# Patient Record
Sex: Female | Born: 1960 | Race: White | Hispanic: No | Marital: Married | Smoking: Former smoker
Health system: Southern US, Community
[De-identification: ages and names within clinical notes are randomized; demographics above are authoritative.]

## PROBLEM LIST (undated history)

## (undated) DIAGNOSIS — N951 Menopausal and female climacteric states: Secondary | ICD-10-CM

## (undated) DIAGNOSIS — T7840XA Allergy, unspecified, initial encounter: Secondary | ICD-10-CM

## (undated) DIAGNOSIS — K2100 Gastro-esophageal reflux disease with esophagitis, without bleeding: Secondary | ICD-10-CM

## (undated) DIAGNOSIS — Z860100 Personal history of colon polyps, unspecified: Secondary | ICD-10-CM

## (undated) DIAGNOSIS — R1310 Dysphagia, unspecified: Secondary | ICD-10-CM

## (undated) DIAGNOSIS — F329 Major depressive disorder, single episode, unspecified: Secondary | ICD-10-CM

## (undated) DIAGNOSIS — K5790 Diverticulosis of intestine, part unspecified, without perforation or abscess without bleeding: Secondary | ICD-10-CM

## (undated) DIAGNOSIS — M1712 Unilateral primary osteoarthritis, left knee: Secondary | ICD-10-CM

## (undated) DIAGNOSIS — E785 Hyperlipidemia, unspecified: Secondary | ICD-10-CM

## (undated) DIAGNOSIS — R7301 Impaired fasting glucose: Secondary | ICD-10-CM

## (undated) DIAGNOSIS — R079 Chest pain, unspecified: Secondary | ICD-10-CM

## (undated) DIAGNOSIS — M23305 Other meniscus derangements, unspecified medial meniscus, unspecified knee: Secondary | ICD-10-CM

## (undated) DIAGNOSIS — N159 Renal tubulo-interstitial disease, unspecified: Secondary | ICD-10-CM

## (undated) DIAGNOSIS — G5600 Carpal tunnel syndrome, unspecified upper limb: Secondary | ICD-10-CM

## (undated) DIAGNOSIS — K5730 Diverticulosis of large intestine without perforation or abscess without bleeding: Secondary | ICD-10-CM

## (undated) DIAGNOSIS — M1990 Unspecified osteoarthritis, unspecified site: Secondary | ICD-10-CM

## (undated) DIAGNOSIS — M4302 Spondylolysis, cervical region: Secondary | ICD-10-CM

## (undated) DIAGNOSIS — Z8719 Personal history of other diseases of the digestive system: Secondary | ICD-10-CM

## (undated) DIAGNOSIS — M8580 Other specified disorders of bone density and structure, unspecified site: Secondary | ICD-10-CM

## (undated) DIAGNOSIS — K635 Polyp of colon: Secondary | ICD-10-CM

## (undated) DIAGNOSIS — F32A Depression, unspecified: Secondary | ICD-10-CM

## (undated) DIAGNOSIS — K219 Gastro-esophageal reflux disease without esophagitis: Secondary | ICD-10-CM

## (undated) DIAGNOSIS — R7303 Prediabetes: Secondary | ICD-10-CM

## (undated) DIAGNOSIS — J309 Allergic rhinitis, unspecified: Secondary | ICD-10-CM

## (undated) DIAGNOSIS — E559 Vitamin D deficiency, unspecified: Secondary | ICD-10-CM

## (undated) DIAGNOSIS — G2581 Restless legs syndrome: Secondary | ICD-10-CM

## (undated) DIAGNOSIS — Z6836 Body mass index (BMI) 36.0-36.9, adult: Secondary | ICD-10-CM

## (undated) DIAGNOSIS — R5383 Other fatigue: Secondary | ICD-10-CM

## (undated) HISTORY — DX: Restless legs syndrome: G25.81

## (undated) HISTORY — PX: TUBAL LIGATION: SHX77

## (undated) HISTORY — PX: WISDOM TOOTH EXTRACTION: SHX21

## (undated) HISTORY — DX: Gastro-esophageal reflux disease without esophagitis: K21.9

## (undated) HISTORY — DX: Allergic rhinitis, unspecified: J30.9

## (undated) HISTORY — DX: Personal history of colonic polyps: Z86.010

## (undated) HISTORY — DX: Diverticulosis of intestine, part unspecified, without perforation or abscess without bleeding: K57.90

## (undated) HISTORY — DX: Gastro-esophageal reflux disease with esophagitis, without bleeding: K21.00

## (undated) HISTORY — DX: Chest pain, unspecified: R07.9

## (undated) HISTORY — PX: COLONOSCOPY: SHX174

## (undated) HISTORY — DX: Polyp of colon: K63.5

## (undated) HISTORY — DX: Major depressive disorder, single episode, unspecified: F32.9

## (undated) HISTORY — PX: UPPER GASTROINTESTINAL ENDOSCOPY: SHX188

## (undated) HISTORY — DX: Allergy, unspecified, initial encounter: T78.40XA

## (undated) HISTORY — PX: FOOT SURGERY: SHX648

## (undated) HISTORY — PX: HIATAL HERNIA REPAIR: SHX195

## (undated) HISTORY — DX: Body mass index (BMI) 36.0-36.9, adult: Z68.36

## (undated) HISTORY — DX: Personal history of other diseases of the digestive system: Z87.19

## (undated) HISTORY — DX: Gastro-esophageal reflux disease with esophagitis: K21.0

## (undated) HISTORY — DX: Carpal tunnel syndrome, unspecified upper limb: G56.00

## (undated) HISTORY — DX: Hyperlipidemia, unspecified: E78.5

## (undated) HISTORY — DX: Unspecified osteoarthritis, unspecified site: M19.90

## (undated) HISTORY — DX: Personal history of colon polyps, unspecified: Z86.0100

## (undated) HISTORY — DX: Other meniscus derangements, unspecified medial meniscus, unspecified knee: M23.305

## (undated) HISTORY — DX: Other fatigue: R53.83

## (undated) HISTORY — DX: Dysphagia, unspecified: R13.10

## (undated) HISTORY — DX: Unilateral primary osteoarthritis, left knee: M17.12

## (undated) HISTORY — PX: TONSILLECTOMY: SUR1361

## (undated) HISTORY — DX: Menopausal and female climacteric states: N95.1

## (undated) HISTORY — DX: Impaired fasting glucose: R73.01

## (undated) HISTORY — DX: Spondylolysis, cervical region: M43.02

## (undated) HISTORY — DX: Vitamin D deficiency, unspecified: E55.9

## (undated) HISTORY — DX: Other specified disorders of bone density and structure, unspecified site: M85.80

## (undated) HISTORY — DX: Diverticulosis of large intestine without perforation or abscess without bleeding: K57.30

## (undated) HISTORY — DX: Depression, unspecified: F32.A

---
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
## 2004-04-01 HISTORY — PX: LEG SURGERY: SHX1003

## 2005-05-30 HISTORY — PX: NECK SURGERY: SHX720

## 2008-09-27 ENCOUNTER — Ambulatory Visit: Payer: Self-pay

## 2008-09-27 ENCOUNTER — Encounter: Payer: Self-pay | Admitting: Cardiovascular Disease

## 2008-09-28 ENCOUNTER — Encounter: Payer: Self-pay | Admitting: Cardiovascular Disease

## 2011-10-05 ENCOUNTER — Encounter (HOSPITAL_COMMUNITY): Payer: Self-pay | Admitting: *Deleted

## 2011-10-05 ENCOUNTER — Emergency Department (HOSPITAL_COMMUNITY): Payer: BC Managed Care – PPO

## 2011-10-05 ENCOUNTER — Emergency Department (HOSPITAL_COMMUNITY)
Admission: EM | Admit: 2011-10-05 | Discharge: 2011-10-05 | Disposition: A | Discharge status: Home / Self Care | Payer: BC Managed Care – PPO | Attending: Emergency Medicine | Admitting: Emergency Medicine

## 2011-10-05 DIAGNOSIS — M2390 Unspecified internal derangement of unspecified knee: Secondary | ICD-10-CM

## 2011-10-05 MED ORDER — PREDNISONE 20 MG PO TABS
60.0000 mg | ORAL_TABLET | Freq: Once | ORAL | Status: AC
  Administered 2011-10-05: 60 mg via ORAL
  Filled 2011-10-05: qty 3

## 2011-10-05 MED ORDER — PREDNISONE 20 MG PO TABS: 40.0000 mg | ORAL_TABLET | Freq: Every day | ORAL | Status: AC

## 2011-10-05 MED ORDER — OXYCODONE-ACETAMINOPHEN 5-325 MG PO TABS
2.0000 | ORAL_TABLET | Freq: Once | ORAL | Status: AC
  Administered 2011-10-05: 2 via ORAL
  Filled 2011-10-05: qty 2

## 2011-10-05 MED ORDER — OXYCODONE-ACETAMINOPHEN 5-325 MG PO TABS: 1.0000 | ORAL_TABLET | ORAL | Status: AC | PRN

## 2011-10-05 NOTE — Progress Notes (Signed)
Orthopedic Tech Progress Note Patient Details:  Brenda Allen Apr 23, 1960 [id number]  Ortho Devices Type of Ortho Device: Crutches Ortho Device/Splint Interventions: Application   [name] 10/05/2011, 9:35 PM

## 2011-10-05 NOTE — ED Notes (Signed)
Pt states that she has been having a pop with her left knee for the past couple of weeks when straighting out her leg she feels a crack/pop which normally doesn't hurt today however she straightened her leg and felt a strong pop and a painful. Pt states cannot walk. Pt put brace from previous injury on left leg for support.

## 2011-10-05 NOTE — ED Provider Notes (Signed)
History     CSN: [id number]  Arrival date & time 10/05/11  1308   None     Chief Complaint  Patient presents with  . Leg Pain    (Consider location/radiation/quality/duration/timing/severity/associated sxs/prior treatment) HPI Comments: Patient states she was getting on a riding lawnmower, when she felt a severe pop in her left knee, with inability to bear weight.  She has a previous history of a Baker's cyst, on the same leg for of which she had a previous brace, which he applied.  A taken no over-the-counter medications for pain.  She does have an orthopedist in [place] who she will be able to see on Monday  Patient is a 51 y.o. female presenting with leg pain. The history is provided by the patient.  Leg Pain  The incident occurred 3 to 5 hours ago. The incident occurred at home. The injury mechanism was torsion. The pain is present in the left knee. The quality of the pain is described as aching. The pain is at a severity of 8/10. The pain is severe. The pain has been constant since onset. Associated symptoms include inability to bear weight and muscle weakness. Pertinent negatives include no numbness, no loss of sensation and no tingling.    History reviewed. No pertinent past medical history.  History reviewed. No pertinent past surgical history.  History reviewed. No pertinent family history.  History  Substance Use Topics  . Smoking status: Never Smoker   . Smokeless tobacco: Not on file  . Alcohol Use: Yes    OB History    Grav Para Term Preterm Abortions TAB SAB Ect Mult Living                  Review of Systems  Constitutional: Negative for fever.  Respiratory: Negative for shortness of breath.   Musculoskeletal: Negative for joint swelling.  Skin: Negative for wound.  Neurological: Negative for dizziness, tingling, weakness and numbness.    Allergies  Review of patient's allergies indicates no known allergies.  Home Medications   Current Outpatient Rx    Name Route Sig Dispense Refill  . HYDROCODONE-ACETAMINOPHEN 5-500 MG PO TABS Oral Take 0.5 tablets by mouth every 6 (six) hours as needed.    Marland Kitchen OMEPRAZOLE 20 MG PO CPDR Oral Take 20 mg by mouth every other day.    Marland Kitchen PHENTERMINE HCL 37.5 MG PO CAPS Oral Take 37.5 mg by mouth every morning.    Marland Kitchen ROPINIROLE HCL 0.25 MG PO TABS Oral Take 0.25 mg by mouth at bedtime as needed. For restless leg syndrome    . TOPIRAMATE 25 MG PO TABS Oral Take 25 mg by mouth at bedtime.    . OXYCODONE-ACETAMINOPHEN 5-325 MG PO TABS Oral Take 1 tablet by mouth every 4 (four) hours as needed for pain. 30 tablet 0  . PREDNISONE 20 MG PO TABS Oral Take 2 tablets (40 mg total) by mouth daily. 20 tablet 0    BP 130/84  Pulse 100  Temp 98.3 F (36.8 C) (Oral)  Resp 18  SpO2 98%  Physical Exam  Constitutional: She appears well-developed and well-nourished.  HENT:  Head: Normocephalic.  Eyes: Pupils are equal, round, and reactive to light.  Neck: Normal range of motion.  Cardiovascular: Normal rate.   Pulmonary/Chest: Effort normal.  Musculoskeletal: She exhibits tenderness. She exhibits no edema.       Legs: Neurological: She is alert.  Skin: Skin is warm. No rash noted. No erythema.    ED  Course  Procedures (including critical care time)  Labs Reviewed - No data to display Dg Knee Complete 4 Views Left  10/05/2011  *RADIOLOGY REPORT*  Clinical Data: Popping sound.  Pain.  LEFT KNEE - COMPLETE 4+ VIEW  Comparison: None.  Findings: No visible joint effusion.  No joint space narrowing.  No focal osseous lesion.  Fabella present dorsal to the lateral femur.  IMPRESSION: No pathologic finding at radiography.  Original Report Authenticated By: Thomasenia Sales, M.D.     1. Knee derangement syndrome       MDM   X-rays are negative for fracture, subluxation, or dislocation.  We'll provide pain medication, as well as anti-inflammatory in the form of steroids and refer patient back to her orthopedist in [place]  on Monday        Arman Filter, NP 10/05/11 2100  Arman Filter, NP 10/05/11 2100

## 2011-10-05 NOTE — ED Notes (Signed)
Pt for discharge.vital signs stable and GCS15.

## 2011-10-11 NOTE — ED Provider Notes (Signed)
Medical screening examination/treatment/procedure(s) were performed by non-physician practitioner and as supervising physician I was immediately available for consultation/collaboration.  Omarie Parcell, MD 10/11/11 0001 

## 2012-11-26 DIAGNOSIS — M1611 Unilateral primary osteoarthritis, right hip: Secondary | ICD-10-CM | POA: Insufficient documentation

## 2012-11-26 HISTORY — DX: Unilateral primary osteoarthritis, right hip: M16.11

## 2013-07-29 ENCOUNTER — Ambulatory Visit (INDEPENDENT_AMBULATORY_CARE_PROVIDER_SITE_OTHER): Payer: BC Managed Care – PPO

## 2013-07-29 VITALS — BP 139/86 | HR 64 | Resp 12

## 2013-07-29 DIAGNOSIS — R52 Pain, unspecified: Secondary | ICD-10-CM

## 2013-07-29 DIAGNOSIS — B351 Tinea unguium: Secondary | ICD-10-CM

## 2013-07-29 DIAGNOSIS — S9031XA Contusion of right foot, initial encounter: Secondary | ICD-10-CM

## 2013-07-29 MED ORDER — TAVABOROLE 5 % EX SOLN
CUTANEOUS | Status: DC
Start: 2013-07-29 — End: 2017-10-17

## 2013-07-29 MED ORDER — MELOXICAM 15 MG PO TABS: 15.0000 mg | ORAL_TABLET | Freq: Every day | ORAL | Status: DC

## 2013-07-29 NOTE — Progress Notes (Signed)
   Subjective:    Patient ID: Brenda Allen, female    DOB: 23-Aug-1960, 53 y.o.   MRN: [id number]  HPI  PT STATED RT FOOT HEEL IS BEEN HURTING FOR 2 MONTHS. THE FOOT IS BEEN THE SAME NOT GETTING WORSE. THE FOOT GET AGGRAVATED BY PRESSURE STANDING OR PRESSURE. TRIED TO USED ICE BUT DOES NOT HELP.  ALSO, BOTH GREAT AND 5TH TOENAIL HAVE DISCOOLRATION FOR 5 MONTHS. THE TOES ARE GETTING WORSE AND GETTING DARKER. TRIED ANTI FUNGUS MED AND IT HELP BUT THE FUNGUS CAME BACK.   Review of Systems  Constitutional: Positive for fatigue.  Musculoskeletal:       Swelling feet and legs.       Objective:   Physical Exam Lower extremity objective findings as follows neurovascular status is intact pedal pulses palpable DP and PT posterior were for capillary refill time 3 seconds all digits epicritic and proprioceptive sensations intact and symmetric bilateral there is normal plantar response DTRs not listed neurologically skin color pigment and hair growth are normal both hallux nails show some yellowing and thickening discoloration distal one third consistent with early onychomycosis possible contusion of toes. Orthopedic biomechanical exam reveals rectus foot type x-rays reveal well-developed inferior calcaneal spur fascial thickening there is a history of contusion while riding a bike she [place] heel about 2 months ago maybe persistent calcaneal bone bruise versus a calcaneal bursitis. At this time pain on palpation central portion of the calcaneal inferiorly patient is also recently been wearing a valgus heel wedge from her orthopedic doctor to try to help trim the foot out and reviewed reviewed reduce her bowleggedness however I suggested a discontinue the valgus wedge which may be contributing to the heel pain and promontory changes of the foot patient can only get to vertical she could not evert the heel which is what your looking for. Recommend discontinue the valgus wedge temporarily to see if the heel  pain resolves or improve.       Assessment & Plan:  Assessment this time is onychomycosis nails initiate topical antifungal therapy prescription for keratin is for [facility] to [facility] patient apply once daily for 12 months duration as instructed  Assessment #2 is calcaneal bruise or contusion with possible calcaneal bursitis recommended MOBIC 15 minutes once daily patient has some at home a new prescription we also furnished also recommend warm compress ice pack alternating hot and cold therapy is discontinued the heel wedge and recheck in one month if no improvement may be a steroid injection for follow next  Harriet Masson DPM

## 2013-07-29 NOTE — Patient Instructions (Signed)
ICE INSTRUCTIONS  Apply ice or cold pack to the affected area at least 3 times a day for 10-15 minutes each time.  You should also use ice after prolonged activity or vigorous exercise.  Do not apply ice longer than 20 minutes at one time.  Always keep a cloth between your skin and the ice pack to prevent burns.  Being consistent and following these instructions will help control your symptoms.  We suggest you purchase a gel ice pack because they are reusable and do bit leak.  Some of them are designed to wrap around the area.  Use the method that works best for you.  Here are some other suggestions for icing.   Use a frozen bag of peas or corn-inexpensive and molds well to your body, usually stays frozen for 10 to 20 minutes.  Wet a towel with cold water and squeeze out the excess until it's damp.  Place in a bag in the freezer for 20 minutes. Then remove and use.  Alternate hot and cold compresses to the heel every evening next  Also suggest discontinuing the heel wedge

## 2013-11-25 ENCOUNTER — Ambulatory Visit (INDEPENDENT_AMBULATORY_CARE_PROVIDER_SITE_OTHER): Payer: BC Managed Care – PPO

## 2013-11-25 DIAGNOSIS — R52 Pain, unspecified: Secondary | ICD-10-CM

## 2013-11-25 DIAGNOSIS — M722 Plantar fascial fibromatosis: Secondary | ICD-10-CM

## 2013-11-25 MED ORDER — TRIAMCINOLONE ACETONIDE 10 MG/ML IJ SUSP: 10.0000 mg | Freq: Once | INTRAMUSCULAR | Status: DC

## 2013-11-25 NOTE — Patient Instructions (Signed)

## 2013-11-25 NOTE — Progress Notes (Signed)
   Subjective:    Patient ID: Brenda Allen, female    DOB: [birth year], 53 y.o.   MRN: [id number]  HPI patient presents at this time for followup has had a flareup of her heel pain right more so than left and bothering her for 54 years now seems to be getting worse has been wearing orthotics as instructed     Review of Systems no new findings or systemic changes noted     Objective:   Physical Exam 53 y.o. white female well-developed well-nourished oriented x3 presents at this time with vital signs stable did have an injury or contusion to the heel about 5 months ago that she improved however this is exacerbated again with activities walking pain on first up in the morning or getting up and time after a period of rest including in the evening. Right is more painful than left from mid arch to the medial calcaneal tubercle bilateral no history of fracture or cyst definite pain and tenderness on palpation of the fat plantar fascial band no pain or lateral compression of the heel or posterior heel no other wounds ulcerations no other abnormal findings skin color pigment and hair growth are normal no signs of infection       Assessment & Plan:  Assessment recalcitrant recurrent plantar fasciitis/heel spur syndrome bilateral right more so than left plan at this time per patient request my recommendation injection 10 mg Kenalog 20 mg Xylocaine plain infiltrated to the inferior plantar fascial band bilateral also fascial strapping applied to both feet recommended ice and patient already has a prescription for [facility] or meloxicam reappointed in one month if no significant improvement maintain stable shoes ice and fascial strapping be left in place for 5 day.  Harriet Masson DPM

## 2014-03-17 ENCOUNTER — Ambulatory Visit (INDEPENDENT_AMBULATORY_CARE_PROVIDER_SITE_OTHER): Payer: BC Managed Care – PPO

## 2014-03-17 VITALS — BP 105/60 | HR 68 | Resp 12

## 2014-03-17 DIAGNOSIS — R52 Pain, unspecified: Secondary | ICD-10-CM

## 2014-03-17 DIAGNOSIS — M7751 Other enthesopathy of right foot: Secondary | ICD-10-CM

## 2014-03-17 DIAGNOSIS — R609 Edema, unspecified: Secondary | ICD-10-CM

## 2014-03-17 DIAGNOSIS — S93601A Unspecified sprain of right foot, initial encounter: Secondary | ICD-10-CM

## 2014-03-17 MED ORDER — TRIAMCINOLONE ACETONIDE 10 MG/ML IJ SUSP
10.0000 mg | Freq: Once | INTRAMUSCULAR | Status: DC
Start: 2014-03-17 — End: 2020-08-09

## 2014-03-17 NOTE — Patient Instructions (Signed)

## 2014-03-17 NOTE — Progress Notes (Signed)
   Subjective:    Patient ID: Brenda Allen, female    DOB: November 25, 1960, 53 y.o.   MRN: [id number]  HPI PT STATED RT BALL OF THE FOOT HAVE WARTS AND BEEN HURTING FOR 2 MONTHS. THE FOOT IS GETTING WORSE AND IT MAKE HARD TO WALK. TRIED OTC CALLUS REMOVAL BUT NO HELP.   Review of Systems  All other systems reviewed and are negative.      Objective:   Physical Exam  53 year old white female well-developed well-nourished oriented 3 presents at this time with a complaint of pain and swelling. Sensation the ball of her foot she thinks it may be a wart although there is a small keratotic lesion it is not in the area or vicinity of pain she has pain in the MTP joint itself tenderness on dorsiflexion of the second digit with swelling and enlargement of the second MTP joint. Neurovascular status otherwise intact pedal pulses palpable epicritic and proprioceptive sensations intact and symmetric patient been having some swelling is on some new medications or fluid pill for that. Patient does report any history of injury trauma or definite swelling in the second MTP joint capsule x-rays reveal no fracture no other osseous abnormality soft tissue swelling around second MTP joint is evident some possible widening of the joint space. Possible mild effusion cannot rule out flexor plate tear or strain of the joint patient does work outside yard and garden may have hyperextended the toe which is now painful and tender cannot rule out a sprain of the joint/capsular tear or flexor plate tear second MTP joint right with residual capsulitis and mild arthropathy.      Assessment & Plan:  Assessment capsulitis possible flexor plate tear arthropathy second MTP right plan at this time recommended warm compress ice pack at this time injection tendons Kenalog 20 mg Xylocaine plain infiltrated the second MTP joint and soft first intermetatarsal space and plantar capsule second MTP. Patient tolerated injection well this time a  strapping of the toe in a plantar fascial splint is applied and a roll of tape is up dispensed the patient to maintain a plantar fascial strapping of the second digit MTP joint to mobilize the MTP joint itself and prevent dorsiflexion. Patient will maintain a good stable shoe warm compress ice pack and over-the-counter NSAID as needed or tolerated. Reappoint within 3-4 weeks for follow-up no barefoot no flimsy shoes or flip-flops if no improvement consider other noninvasive or invasive treatments as needed  Harriet Masson DPM

## 2014-11-11 ENCOUNTER — Encounter: Payer: Self-pay | Admitting: Podiatry

## 2014-11-11 ENCOUNTER — Ambulatory Visit (INDEPENDENT_AMBULATORY_CARE_PROVIDER_SITE_OTHER): Payer: BLUE CROSS/BLUE SHIELD | Admitting: Podiatry

## 2014-11-11 ENCOUNTER — Ambulatory Visit (INDEPENDENT_AMBULATORY_CARE_PROVIDER_SITE_OTHER): Payer: BLUE CROSS/BLUE SHIELD

## 2014-11-11 VITALS — BP 163/104 | HR 95 | Resp 18

## 2014-11-11 DIAGNOSIS — R52 Pain, unspecified: Secondary | ICD-10-CM | POA: Diagnosis not present

## 2014-11-11 DIAGNOSIS — M7751 Other enthesopathy of right foot: Secondary | ICD-10-CM

## 2014-11-11 DIAGNOSIS — S8991XS Unspecified injury of right lower leg, sequela: Secondary | ICD-10-CM | POA: Diagnosis not present

## 2014-11-11 DIAGNOSIS — S99921S Unspecified injury of right foot, sequela: Secondary | ICD-10-CM

## 2014-11-11 NOTE — Progress Notes (Signed)
   Subjective:    Patient ID: Brenda Allen, female    DOB: 21-Mar-1961, 54 y.o.   MRN: [id number]  HPI  54 year old renal presents the office with concerns of pain to the ball of her right foot which has been ongoing for several months. She was last seen with Dr. Blenda Mounts in December in which a steroid injection was given. She did have some relief after the injection however did not last very long for the pain surgery occur. She has pain in the ball of her foot mostly on the second toe joint but to weightbearing and pressure. She denies any history of injury or trauma. Denies any numbness or tingling. Denies any swelling or redness. He states it feels a "toothache". No other complaints this time    Review of Systems  All other systems reviewed and are negative.      Objective:   Physical Exam AAO x3, NAD DP/PT pulses palpable bilaterally, CRT less than 3 seconds Protective sensation intact with Simms Weinstein monofilament, vibratory sensation intact, Achilles tendon reflex intact There is tenderness palpation just distal to the second metatarsal head and the sulcus of the second digit plantarly. There is slight hammertoe contracture. There is no pain or crepitation with second MTPJ range of motion is no pinpoint bony tenderness or pain the vibratory sensation. There does appear he some discomfort upon dorsiflexion of second digit however there is no significant subluxation compared to contralateral extremity. Equinus present. No areas of tenderness to bilateral lower extremities. MMT 5/5, ROM WNL.  No open lesions or pre-ulcerative lesions.  No overlying edema, erythema, increase in warmth to bilateral lower extremities.  No pain with calf compression, swelling, warmth, erythema bilaterally.      Assessment & Plan:  54 year old female right second possible plantar plate injury; capsulitis -X-rays were obtained and reviewed with the patient.  -Treatment options discussed including all  alternatives, risks, and complications -Discussed steroid injection however she did not have long-term results as well as injections and therefore we will hold off on another one. -Dispensed offloading pads to help offload the second metatarsal phalangeal joint. -Toe crest to help stabilize the toe in plantar flexion. -Discussed shoe gear modifications -Follow-up 4 weeks or sooner if any problems arise. In the meantime, encouraged to call the office with any questions, concerns, change in symptoms.   Celesta Gentile, DPM

## 2014-12-09 ENCOUNTER — Ambulatory Visit (INDEPENDENT_AMBULATORY_CARE_PROVIDER_SITE_OTHER): Payer: BLUE CROSS/BLUE SHIELD | Admitting: Podiatry

## 2014-12-09 ENCOUNTER — Encounter: Payer: Self-pay | Admitting: Podiatry

## 2014-12-09 VITALS — BP 148/89 | HR 82 | Resp 18

## 2014-12-09 DIAGNOSIS — R52 Pain, unspecified: Secondary | ICD-10-CM

## 2014-12-09 DIAGNOSIS — S99921S Unspecified injury of right foot, sequela: Secondary | ICD-10-CM

## 2014-12-09 DIAGNOSIS — M7751 Other enthesopathy of right foot: Secondary | ICD-10-CM | POA: Diagnosis not present

## 2014-12-09 DIAGNOSIS — S8991XS Unspecified injury of right lower leg, sequela: Secondary | ICD-10-CM

## 2014-12-09 MED ORDER — TRIAMCINOLONE ACETONIDE 10 MG/ML IJ SUSP: 10.0000 mg | Freq: Once | INTRAMUSCULAR | Status: DC

## 2014-12-15 NOTE — Progress Notes (Signed)
Patient ID: Brenda Allen, female   DOB: 1960/04/07, 54 y.o.   MRN: [id number]  Subjective: 54 year old female presents the office they for follow-up evaluation of right second MTPJ pain and possible plantar plate injury. She states that she continues to have pain to the area is about the same as what it was previously. She feels that the area is tight overlying the joint of the second MPJ on the right foot. She gets some intermittent swelling along the area without any associated erythema or increase in warmth. The pain does not wake her up at night. No tingling or numbness. No other complaints at this time.  Objective: AAO 3, NAD Neurovascular status intact and unchanged This continuation of tenderness on the plantar aspect just distal to the second MTPJ plantarly on the course of the plantar plane. There is no gross subluxation compared to the contralateral extremity. Today more the pain seems to be localized over the MPJ as well as. There is mild discomfort with MPJ range of motion which is new compared to last appointment. There is mild edema overlying the joint without any associated erythema or increase in warmth. There is no crepitation with MTPJ range of motion. There is no open lesions or pre-ulcerative lesions. No pain with calf compression, swelling, warmth, erythema.  Assessment: 54 year old female right second MTPJ capsulitis, possible plantar plate injury  Plan: -Treatment options discussed including all alternatives, risks, and complications -I discussed with her joint aspiration and injection to the second MTPJ due to the swelling and the pain along the joint. I discussed her risks and, occasions the injection for which she understands and wishes to proceed. Under sterile conditions a total of 2 mL of a mixture of 2% lidocaine plain and 0.5% Marcaine was infiltrated in a regional block fashion around the aspiration site. With anesthetized the skin was then prepped in sterile fashion  with Betadine. Utilizing 18-gauge needle the second MTPJ was aspirated and clear fluid was aspirated. There is no pus or signs of infection. Next a 1/4 cc Kenalog 10, 1/4 cc dexamethasone phosphate, 1/4 cc 2% lidocaine plain was infiltrated into and around the second MTPJ. She tolerated injection well any complications. Post injection care was discussed. -Offloading pads were dispensed. -Discussed the symptoms continue should likely benefit from new custom orthotics. -Follow-up as directed or sooner if any problems are to arise. In the meantime call the office with any questions, concerns, change in symptoms.  Celesta Gentile, DPM

## 2014-12-31 HISTORY — PX: KNEE ARTHROPLASTY: SHX992

## 2015-03-29 HISTORY — PX: COLONOSCOPY: SHX174

## 2015-07-01 HISTORY — PX: HIP SURGERY: SHX245

## 2015-07-07 DIAGNOSIS — M1612 Unilateral primary osteoarthritis, left hip: Secondary | ICD-10-CM | POA: Diagnosis not present

## 2015-08-11 DIAGNOSIS — R635 Abnormal weight gain: Secondary | ICD-10-CM | POA: Diagnosis not present

## 2015-08-11 DIAGNOSIS — R5381 Other malaise: Secondary | ICD-10-CM | POA: Diagnosis not present

## 2015-09-08 DIAGNOSIS — R5383 Other fatigue: Secondary | ICD-10-CM | POA: Diagnosis not present

## 2015-09-18 DIAGNOSIS — Z6835 Body mass index (BMI) 35.0-35.9, adult: Secondary | ICD-10-CM | POA: Diagnosis not present

## 2015-09-18 DIAGNOSIS — L237 Allergic contact dermatitis due to plants, except food: Secondary | ICD-10-CM | POA: Diagnosis not present

## 2015-10-06 DIAGNOSIS — R5383 Other fatigue: Secondary | ICD-10-CM | POA: Diagnosis not present

## 2015-11-03 DIAGNOSIS — Z6834 Body mass index (BMI) 34.0-34.9, adult: Secondary | ICD-10-CM | POA: Diagnosis not present

## 2015-11-03 DIAGNOSIS — R5383 Other fatigue: Secondary | ICD-10-CM | POA: Diagnosis not present

## 2015-11-28 DIAGNOSIS — D225 Melanocytic nevi of trunk: Secondary | ICD-10-CM | POA: Diagnosis not present

## 2015-11-28 DIAGNOSIS — L578 Other skin changes due to chronic exposure to nonionizing radiation: Secondary | ICD-10-CM | POA: Diagnosis not present

## 2015-11-28 DIAGNOSIS — L821 Other seborrheic keratosis: Secondary | ICD-10-CM | POA: Diagnosis not present

## 2015-12-01 DIAGNOSIS — Z6833 Body mass index (BMI) 33.0-33.9, adult: Secondary | ICD-10-CM | POA: Diagnosis not present

## 2015-12-01 DIAGNOSIS — R5383 Other fatigue: Secondary | ICD-10-CM | POA: Diagnosis not present

## 2015-12-14 DIAGNOSIS — Z1231 Encounter for screening mammogram for malignant neoplasm of breast: Secondary | ICD-10-CM | POA: Diagnosis not present

## 2015-12-14 DIAGNOSIS — J320 Chronic maxillary sinusitis: Secondary | ICD-10-CM | POA: Diagnosis not present

## 2015-12-14 DIAGNOSIS — Z6834 Body mass index (BMI) 34.0-34.9, adult: Secondary | ICD-10-CM | POA: Diagnosis not present

## 2015-12-14 DIAGNOSIS — H6692 Otitis media, unspecified, left ear: Secondary | ICD-10-CM | POA: Diagnosis not present

## 2015-12-18 DIAGNOSIS — Z1389 Encounter for screening for other disorder: Secondary | ICD-10-CM | POA: Diagnosis not present

## 2015-12-18 DIAGNOSIS — R7301 Impaired fasting glucose: Secondary | ICD-10-CM | POA: Diagnosis not present

## 2015-12-18 DIAGNOSIS — E785 Hyperlipidemia, unspecified: Secondary | ICD-10-CM | POA: Diagnosis not present

## 2015-12-18 DIAGNOSIS — Z0000 Encounter for general adult medical examination without abnormal findings: Secondary | ICD-10-CM | POA: Diagnosis not present

## 2015-12-18 DIAGNOSIS — E559 Vitamin D deficiency, unspecified: Secondary | ICD-10-CM | POA: Diagnosis not present

## 2015-12-27 DIAGNOSIS — M1712 Unilateral primary osteoarthritis, left knee: Secondary | ICD-10-CM | POA: Diagnosis not present

## 2015-12-29 DIAGNOSIS — R5383 Other fatigue: Secondary | ICD-10-CM | POA: Diagnosis not present

## 2015-12-29 DIAGNOSIS — Z6832 Body mass index (BMI) 32.0-32.9, adult: Secondary | ICD-10-CM | POA: Diagnosis not present

## 2016-02-06 DIAGNOSIS — Z1231 Encounter for screening mammogram for malignant neoplasm of breast: Secondary | ICD-10-CM | POA: Diagnosis not present

## 2016-02-06 DIAGNOSIS — M8589 Other specified disorders of bone density and structure, multiple sites: Secondary | ICD-10-CM | POA: Diagnosis not present

## 2016-02-06 DIAGNOSIS — M8588 Other specified disorders of bone density and structure, other site: Secondary | ICD-10-CM | POA: Diagnosis not present

## 2016-03-04 DIAGNOSIS — K210 Gastro-esophageal reflux disease with esophagitis: Secondary | ICD-10-CM | POA: Diagnosis not present

## 2016-03-04 DIAGNOSIS — R1310 Dysphagia, unspecified: Secondary | ICD-10-CM | POA: Diagnosis not present

## 2016-03-11 DIAGNOSIS — R1310 Dysphagia, unspecified: Secondary | ICD-10-CM | POA: Diagnosis not present

## 2016-03-20 DIAGNOSIS — R1310 Dysphagia, unspecified: Secondary | ICD-10-CM | POA: Diagnosis not present

## 2016-03-20 DIAGNOSIS — Z79899 Other long term (current) drug therapy: Secondary | ICD-10-CM | POA: Diagnosis not present

## 2016-03-20 DIAGNOSIS — K210 Gastro-esophageal reflux disease with esophagitis: Secondary | ICD-10-CM | POA: Diagnosis not present

## 2016-03-20 DIAGNOSIS — K219 Gastro-esophageal reflux disease without esophagitis: Secondary | ICD-10-CM | POA: Diagnosis not present

## 2016-03-20 DIAGNOSIS — K222 Esophageal obstruction: Secondary | ICD-10-CM | POA: Diagnosis not present

## 2016-03-20 DIAGNOSIS — M1990 Unspecified osteoarthritis, unspecified site: Secondary | ICD-10-CM | POA: Diagnosis not present

## 2016-03-20 HISTORY — PX: UPPER GI ENDOSCOPY: SHX6162

## 2016-04-08 DIAGNOSIS — Z6835 Body mass index (BMI) 35.0-35.9, adult: Secondary | ICD-10-CM | POA: Diagnosis not present

## 2016-04-08 DIAGNOSIS — J019 Acute sinusitis, unspecified: Secondary | ICD-10-CM | POA: Diagnosis not present

## 2016-04-08 DIAGNOSIS — J208 Acute bronchitis due to other specified organisms: Secondary | ICD-10-CM | POA: Diagnosis not present

## 2016-06-28 ENCOUNTER — Ambulatory Visit (INDEPENDENT_AMBULATORY_CARE_PROVIDER_SITE_OTHER): Payer: BLUE CROSS/BLUE SHIELD | Admitting: Podiatry

## 2016-06-28 ENCOUNTER — Ambulatory Visit (INDEPENDENT_AMBULATORY_CARE_PROVIDER_SITE_OTHER): Payer: BLUE CROSS/BLUE SHIELD

## 2016-06-28 DIAGNOSIS — M2041 Other hammer toe(s) (acquired), right foot: Secondary | ICD-10-CM | POA: Diagnosis not present

## 2016-06-28 DIAGNOSIS — M779 Enthesopathy, unspecified: Secondary | ICD-10-CM

## 2016-06-28 DIAGNOSIS — M7751 Other enthesopathy of right foot: Secondary | ICD-10-CM

## 2016-06-28 DIAGNOSIS — M2011 Hallux valgus (acquired), right foot: Secondary | ICD-10-CM | POA: Diagnosis not present

## 2016-06-28 DIAGNOSIS — R52 Pain, unspecified: Secondary | ICD-10-CM | POA: Diagnosis not present

## 2016-06-28 NOTE — Progress Notes (Signed)
Subjective: 56 year old female presents the office today for concerns of recurrence her right foot pain. This been ongoing the last 6 months or so. She states that possibly she is doing very well however she started have pain and she points on the first interspace and the right foot which is the majority symptoms. She denies numbness and tingling. She also states that she is a hammertoe of the second toe which is causing irritation in shoes. She does have orthotics all of her shoes which she feels is somewhat more support than others. She cannot recall what aggravates her symptoms or if the certain insert helps with an others. She denies any recent injury or trauma. Denies any systemic complaints such as fevers, chills, nausea, vomiting. No acute changes since last appointment, and no other complaints at this time.   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 Semirigid hammertoe is present of the right second toe there is mild tenderness in the dorsal aspect of the PIPJ and is mild irritation or rubs the shoes. The majority tenderness today appears to be in the first interspace and the right foot. There is no specific area pinpoint bony tenderness or pain the vibratory sensation. There is no edema, erythema, increase in warmth.  No open lesions or pre-ulcerative lesions.  No pain with calf compression, swelling, warmth, erythema  Assessment: Right foot pain, tendinitis, hammertoe/HAV  Plan: -All treatment options discussed with the patient including all alternatives, risks, complications.  -Repeated she's were obtained and reviewed. There is no evidence of acute fracture or stress fracture identified today. -Discussed change in orthotics. I want her to pay attention the next couple weeks to see that a certain insert that helps more than others. I would try to wear 1 the incision has more arch support and this may do  better for her. -Discussed hammertoe and bunion surgery with the patient but she wishes to hold off now. We will continue his shoe gear modifications orthotics. -Discussed a steroid injection to the first interspace and the right foot and she wishes to proceed. Under sterile conditions a mixture of Kenalog and the consent was infiltrated without complications. Post injection care was discussed. -Return to clinic in 4 weeks or sooner if needed. -Patient encouraged to call the office with any questions, concerns, change in symptoms.   Celesta Gentile, DPM

## 2016-07-12 DIAGNOSIS — M1611 Unilateral primary osteoarthritis, right hip: Secondary | ICD-10-CM | POA: Diagnosis not present

## 2016-07-26 ENCOUNTER — Ambulatory Visit: Payer: BC Managed Care – PPO | Admitting: Podiatry

## 2016-09-10 DIAGNOSIS — Z6836 Body mass index (BMI) 36.0-36.9, adult: Secondary | ICD-10-CM | POA: Diagnosis not present

## 2016-09-10 DIAGNOSIS — R5381 Other malaise: Secondary | ICD-10-CM | POA: Diagnosis not present

## 2016-09-17 DIAGNOSIS — Z96652 Presence of left artificial knee joint: Secondary | ICD-10-CM | POA: Diagnosis not present

## 2016-09-17 DIAGNOSIS — M1711 Unilateral primary osteoarthritis, right knee: Secondary | ICD-10-CM | POA: Diagnosis not present

## 2016-09-19 DIAGNOSIS — N951 Menopausal and female climacteric states: Secondary | ICD-10-CM | POA: Diagnosis not present

## 2016-09-19 DIAGNOSIS — R635 Abnormal weight gain: Secondary | ICD-10-CM | POA: Diagnosis not present

## 2016-09-23 DIAGNOSIS — E039 Hypothyroidism, unspecified: Secondary | ICD-10-CM | POA: Diagnosis not present

## 2016-09-23 DIAGNOSIS — E7800 Pure hypercholesterolemia, unspecified: Secondary | ICD-10-CM | POA: Diagnosis not present

## 2016-09-23 DIAGNOSIS — E669 Obesity, unspecified: Secondary | ICD-10-CM | POA: Diagnosis not present

## 2016-09-23 DIAGNOSIS — R7301 Impaired fasting glucose: Secondary | ICD-10-CM | POA: Diagnosis not present

## 2016-09-26 ENCOUNTER — Ambulatory Visit (INDEPENDENT_AMBULATORY_CARE_PROVIDER_SITE_OTHER): Payer: BLUE CROSS/BLUE SHIELD

## 2016-09-26 ENCOUNTER — Ambulatory Visit (INDEPENDENT_AMBULATORY_CARE_PROVIDER_SITE_OTHER): Payer: BLUE CROSS/BLUE SHIELD | Admitting: Podiatry

## 2016-09-26 ENCOUNTER — Encounter: Payer: Self-pay | Admitting: Podiatry

## 2016-09-26 DIAGNOSIS — M7742 Metatarsalgia, left foot: Secondary | ICD-10-CM | POA: Diagnosis not present

## 2016-09-26 DIAGNOSIS — M7752 Other enthesopathy of left foot: Secondary | ICD-10-CM

## 2016-09-26 DIAGNOSIS — R52 Pain, unspecified: Secondary | ICD-10-CM

## 2016-09-26 NOTE — Progress Notes (Signed)
Subjective: 56 year old female presents the office of consent left foot pain which been ongoing for 6 weeks. She states that she's been active and she has been moving her daughter and on her feet more she gets pain in the ball of her foot. She is the right side is doing well. The left side she denies any recent injury or trauma she denies any numbness or tingling. She points between the second third toes which is the majority of symptoms. The pain is intermittent in nature area was a gradual onset. Denies any systemic complaints such as fevers, chills, nausea, vomiting. No acute changes since last appointment, and no other complaints at this time.   Objective: AAO x3, NAD DP/PT pulses palpable bilaterally, CRT less than 3 seconds On the left foot there is tenderness palpation along the second interspace. There is no probable neuroma identified. There is minimal edema to this area there is no erythema or increase in warmth. There is no area pinpoint bony tenderness there is no pain vibratory sensation. No open lesions or pre-ulcerative lesions.  No pain with calf compression, swelling, warmth, erythema  Assessment: Capsulitis/Bursitis Left Second Interspace  Plan: -All treatment options discussed with the patient including all alternatives, risks, complications.  -X-rays were obtained and reviewed. No evidence of acute fracture identified. -Steroid injection was completed today into the area of maximal tenderness to left second interspace and the complications. Mixture of Kenalog and local anesthetic was infiltrated. Postinjection care with discussed. -Metatarsal operative pads. Continue orthotics. -Ice to the area. -Follow-up 4 weeks or sooner if needed. -Patient encouraged to call the office with any questions, concerns, change in symptoms.   Celesta Gentile, DPM

## 2016-09-30 DIAGNOSIS — E669 Obesity, unspecified: Secondary | ICD-10-CM | POA: Diagnosis not present

## 2016-09-30 DIAGNOSIS — R7301 Impaired fasting glucose: Secondary | ICD-10-CM | POA: Diagnosis not present

## 2016-10-07 DIAGNOSIS — E669 Obesity, unspecified: Secondary | ICD-10-CM | POA: Diagnosis not present

## 2016-10-07 DIAGNOSIS — R7301 Impaired fasting glucose: Secondary | ICD-10-CM | POA: Diagnosis not present

## 2016-10-07 DIAGNOSIS — E7800 Pure hypercholesterolemia, unspecified: Secondary | ICD-10-CM | POA: Diagnosis not present

## 2016-10-08 DIAGNOSIS — Z6834 Body mass index (BMI) 34.0-34.9, adult: Secondary | ICD-10-CM | POA: Diagnosis not present

## 2016-10-08 DIAGNOSIS — R5383 Other fatigue: Secondary | ICD-10-CM | POA: Diagnosis not present

## 2016-10-14 DIAGNOSIS — E669 Obesity, unspecified: Secondary | ICD-10-CM | POA: Diagnosis not present

## 2016-10-14 DIAGNOSIS — E7800 Pure hypercholesterolemia, unspecified: Secondary | ICD-10-CM | POA: Diagnosis not present

## 2016-10-14 DIAGNOSIS — R7301 Impaired fasting glucose: Secondary | ICD-10-CM | POA: Diagnosis not present

## 2016-10-21 DIAGNOSIS — N951 Menopausal and female climacteric states: Secondary | ICD-10-CM | POA: Diagnosis not present

## 2016-10-21 DIAGNOSIS — R7301 Impaired fasting glucose: Secondary | ICD-10-CM | POA: Diagnosis not present

## 2016-10-21 DIAGNOSIS — E039 Hypothyroidism, unspecified: Secondary | ICD-10-CM | POA: Diagnosis not present

## 2016-10-21 DIAGNOSIS — E669 Obesity, unspecified: Secondary | ICD-10-CM | POA: Diagnosis not present

## 2016-10-24 ENCOUNTER — Encounter: Payer: Self-pay | Admitting: Podiatry

## 2016-10-24 ENCOUNTER — Ambulatory Visit (INDEPENDENT_AMBULATORY_CARE_PROVIDER_SITE_OTHER): Payer: BLUE CROSS/BLUE SHIELD | Admitting: Podiatry

## 2016-10-24 DIAGNOSIS — M779 Enthesopathy, unspecified: Secondary | ICD-10-CM | POA: Diagnosis not present

## 2016-10-24 DIAGNOSIS — M7752 Other enthesopathy of left foot: Secondary | ICD-10-CM | POA: Diagnosis not present

## 2016-10-25 NOTE — Progress Notes (Signed)
Subjective: Ms. Suman presents the office they for follow-up evaluation of left foot pain. She says the injection helped for 2 days for the pain started to come back. She states that she does continue with her orthotics and she brought in a couple pairs of inserts made to look at today. She denies any recent injury or trauma. She denies numbness and tingling at this time. No swelling or redness. No other concerns. Denies any systemic complaints such as fevers, chills, nausea, vomiting. No acute changes since last appointment, and no other complaints at this time.   Objective: AAO x3, NAD DP/PT pulses palpable bilaterally, CRT less than 3 seconds There is tenderness palpation on the second interspace left foot. No palpable neuroma identified this time. There is no area pinpoint bony tenderness or pain the vibratory sensation. There is no overlying edema, erythema, increase in warmth.  No open lesions or pre-ulcerative lesions.  No pain with calf compression, swelling, warmth, erythema  Assessment: Left foot second interspace capsulitis/bursitis  Plan: -All treatment options discussed with the patient including all alternatives, risks, complications.  -Today second steroid injection was performed in the second interspace without complications. A mixture of Kenalog local anesthetic was infiltrated without case. Postinjection care was discussed. -I did modify what her orthotics that are more comfortable for her to add a pad to help take pressure off this area. Discussed that she may need to have the inserts recovered. -Follow-up in 4 weeks or sooner if needed. Call any questions or concerns. -Patient encouraged to call the office with any questions, concerns, change in symptoms.   Celesta Gentile, DPM

## 2016-10-28 DIAGNOSIS — R6882 Decreased libido: Secondary | ICD-10-CM | POA: Diagnosis not present

## 2016-10-28 DIAGNOSIS — L709 Acne, unspecified: Secondary | ICD-10-CM | POA: Diagnosis not present

## 2016-10-28 DIAGNOSIS — M2550 Pain in unspecified joint: Secondary | ICD-10-CM | POA: Diagnosis not present

## 2016-10-28 DIAGNOSIS — E669 Obesity, unspecified: Secondary | ICD-10-CM | POA: Diagnosis not present

## 2016-11-04 DIAGNOSIS — E669 Obesity, unspecified: Secondary | ICD-10-CM | POA: Diagnosis not present

## 2016-11-04 DIAGNOSIS — N921 Excessive and frequent menstruation with irregular cycle: Secondary | ICD-10-CM | POA: Diagnosis not present

## 2016-11-05 DIAGNOSIS — D518 Other vitamin B12 deficiency anemias: Secondary | ICD-10-CM | POA: Diagnosis not present

## 2016-11-05 DIAGNOSIS — E663 Overweight: Secondary | ICD-10-CM | POA: Diagnosis not present

## 2016-11-05 DIAGNOSIS — J301 Allergic rhinitis due to pollen: Secondary | ICD-10-CM | POA: Diagnosis not present

## 2016-11-05 DIAGNOSIS — R5383 Other fatigue: Secondary | ICD-10-CM | POA: Diagnosis not present

## 2016-11-05 DIAGNOSIS — E782 Mixed hyperlipidemia: Secondary | ICD-10-CM | POA: Diagnosis not present

## 2016-11-11 DIAGNOSIS — R5383 Other fatigue: Secondary | ICD-10-CM | POA: Diagnosis not present

## 2016-11-11 DIAGNOSIS — N951 Menopausal and female climacteric states: Secondary | ICD-10-CM | POA: Diagnosis not present

## 2016-11-11 DIAGNOSIS — L709 Acne, unspecified: Secondary | ICD-10-CM | POA: Diagnosis not present

## 2016-11-11 DIAGNOSIS — E669 Obesity, unspecified: Secondary | ICD-10-CM | POA: Diagnosis not present

## 2016-11-18 DIAGNOSIS — E669 Obesity, unspecified: Secondary | ICD-10-CM | POA: Diagnosis not present

## 2016-11-18 DIAGNOSIS — L709 Acne, unspecified: Secondary | ICD-10-CM | POA: Diagnosis not present

## 2016-11-18 DIAGNOSIS — R7301 Impaired fasting glucose: Secondary | ICD-10-CM | POA: Diagnosis not present

## 2016-11-18 DIAGNOSIS — E039 Hypothyroidism, unspecified: Secondary | ICD-10-CM | POA: Diagnosis not present

## 2016-11-20 DIAGNOSIS — E669 Obesity, unspecified: Secondary | ICD-10-CM | POA: Diagnosis not present

## 2016-11-25 DIAGNOSIS — E7800 Pure hypercholesterolemia, unspecified: Secondary | ICD-10-CM | POA: Diagnosis not present

## 2016-11-25 DIAGNOSIS — E669 Obesity, unspecified: Secondary | ICD-10-CM | POA: Diagnosis not present

## 2016-11-28 DIAGNOSIS — E669 Obesity, unspecified: Secondary | ICD-10-CM | POA: Diagnosis not present

## 2016-11-29 ENCOUNTER — Telehealth: Payer: Self-pay | Admitting: Podiatry

## 2016-11-29 NOTE — Telephone Encounter (Signed)
I was calling to see if I can get an MRI on both feet. I'm in a lot of pain in both feet. Dr. Jacqualyn Posey said that would be the next step.

## 2016-12-03 DIAGNOSIS — E663 Overweight: Secondary | ICD-10-CM | POA: Diagnosis not present

## 2016-12-03 DIAGNOSIS — R5383 Other fatigue: Secondary | ICD-10-CM | POA: Diagnosis not present

## 2016-12-03 DIAGNOSIS — D518 Other vitamin B12 deficiency anemias: Secondary | ICD-10-CM | POA: Diagnosis not present

## 2016-12-03 DIAGNOSIS — Z6832 Body mass index (BMI) 32.0-32.9, adult: Secondary | ICD-10-CM | POA: Diagnosis not present

## 2016-12-03 DIAGNOSIS — E669 Obesity, unspecified: Secondary | ICD-10-CM | POA: Diagnosis not present

## 2016-12-04 ENCOUNTER — Telehealth: Payer: Self-pay | Admitting: *Deleted

## 2016-12-04 DIAGNOSIS — D3610 Benign neoplasm of peripheral nerves and autonomic nervous system, unspecified: Secondary | ICD-10-CM

## 2016-12-04 NOTE — Telephone Encounter (Signed)
Please schedule an MRI of the left foot to look for a neuroma. Also, please confirm with her it is her left foot as I have been treating her for the right previously.

## 2016-12-04 NOTE — Telephone Encounter (Addendum)
Pt called states she would like to schedule a MRI.12/04/2016-Pt states she is in agony with both feet, and would like both MRI'd. Dr. Jacqualyn Posey ordered MRI without and with contrast r/o neuroma. Gave orders to [facility] for pre-cert and faxed to [facility].12/31/2016-Pt emailed Pennsaid gel as the medications she had used previously. Dr. Amalia Hailey states order Pennsaid 2% 112g bottle apply to affected area 3-4 times daily. I emailed pt the rx had been sent to [facility].

## 2016-12-05 DIAGNOSIS — R7301 Impaired fasting glucose: Secondary | ICD-10-CM | POA: Diagnosis not present

## 2016-12-05 DIAGNOSIS — E669 Obesity, unspecified: Secondary | ICD-10-CM | POA: Diagnosis not present

## 2016-12-05 DIAGNOSIS — E7800 Pure hypercholesterolemia, unspecified: Secondary | ICD-10-CM | POA: Diagnosis not present

## 2016-12-11 ENCOUNTER — Telehealth: Payer: Self-pay | Admitting: *Deleted

## 2016-12-11 NOTE — Telephone Encounter (Signed)
"  Patient needs authorization for B/L foot MRI with and without contrast.  They need authorization from [facility].  She's scheduled for 12/21/2016.

## 2016-12-16 DIAGNOSIS — L709 Acne, unspecified: Secondary | ICD-10-CM | POA: Diagnosis not present

## 2016-12-16 DIAGNOSIS — E669 Obesity, unspecified: Secondary | ICD-10-CM | POA: Diagnosis not present

## 2016-12-16 DIAGNOSIS — R7301 Impaired fasting glucose: Secondary | ICD-10-CM | POA: Diagnosis not present

## 2016-12-16 DIAGNOSIS — E039 Hypothyroidism, unspecified: Secondary | ICD-10-CM | POA: Diagnosis not present

## 2016-12-19 NOTE — Telephone Encounter (Signed)
I called and left Noelle a message that Bilateral MRI for this patient was authorized by [employer].  The authorization number for the right foot is [id number] and the left foot is [id number].

## 2016-12-21 ENCOUNTER — Ambulatory Visit
Admission: RE | Admit: 2016-12-21 | Discharge: 2016-12-21 | Disposition: A | Discharge status: Home / Self Care | Payer: Self-pay | Source: Ambulatory Visit | Attending: Podiatry | Admitting: Podiatry

## 2016-12-21 DIAGNOSIS — M71471 Calcium deposit in bursa, right ankle and foot: Secondary | ICD-10-CM | POA: Diagnosis not present

## 2016-12-21 DIAGNOSIS — M71472 Calcium deposit in bursa, left ankle and foot: Secondary | ICD-10-CM | POA: Diagnosis not present

## 2016-12-21 MED ORDER — GADOBENATE DIMEGLUMINE 529 MG/ML IV SOLN
19.0000 mL | Freq: Once | INTRAVENOUS | Status: AC | PRN
  Administered 2016-12-21: 19 mL via INTRAVENOUS

## 2016-12-23 ENCOUNTER — Encounter: Payer: Self-pay | Admitting: Podiatry

## 2016-12-23 ENCOUNTER — Telehealth: Payer: Self-pay | Admitting: Podiatry

## 2016-12-23 ENCOUNTER — Ambulatory Visit (INDEPENDENT_AMBULATORY_CARE_PROVIDER_SITE_OTHER): Payer: BLUE CROSS/BLUE SHIELD | Admitting: Podiatry

## 2016-12-23 DIAGNOSIS — M7752 Other enthesopathy of left foot: Secondary | ICD-10-CM

## 2016-12-23 DIAGNOSIS — M2540 Effusion, unspecified joint: Secondary | ICD-10-CM | POA: Diagnosis not present

## 2016-12-23 NOTE — Telephone Encounter (Signed)
I had an MRI done on Saturday. I was wanting to know if I can get the results over the phone or do I have to come in? Please let me know. You can call me back at [phone]. Thank you.

## 2016-12-23 NOTE — Telephone Encounter (Signed)
IMPRESSION: 1. Mild forefoot and moderate midfoot degenerative changes. 2. No stress fracture or AVN. 3. Mild intermetatarsal bursitis between the second and third and third and fourth metatarsal heads. No findings suspicious for Morton's neuroma.  Would you like for me to schedule her to come see you or explain this to her over the phone?

## 2016-12-23 NOTE — Telephone Encounter (Signed)
Patient came in to be seen today.

## 2016-12-23 NOTE — Patient Instructions (Signed)

## 2016-12-24 ENCOUNTER — Encounter: Payer: Self-pay | Admitting: Podiatry

## 2016-12-26 NOTE — Progress Notes (Signed)
Subjective: Brenda Allen presents the office today with her husband discuss MRI results due to chronic bilateral foot pain. She states in length that she gets some joint pain in the second toe and some to 22nd and third toe. She also gets pain in between the toes on the right foot. This been a chronic issue this point of last several months. She denies any recent injury or trauma she denies any acute changes since last appointment. Denies any systemic complaints such as fevers, chills, nausea, vomiting. No acute changes since last appointment, and no other complaints at this time.   Objective: AAO x3, NAD DP/PT pulses palpable bilaterally, CRT less than 3 seconds On the left foot there is tenderness with long second metatarsal phalangeal joint and there is trace edema to this area there is no erythema or increase in warmth. There is minimal discomfort in the second interspace. There is no palpable neuroma identified. Is no pain vibratory sensation there is no area pinpoint bony tenderness. On the right foot there is tenderness on the interspace and the right foot there is no area pinpoint tenderness there is no pain the vibratory sensation. There is no overlying edema, erythema, increased warmth of the right foot. No discomfort along the plantar fascia or on the course today. The stepdown Achilles tendon which appears to be intact. There is no other area of tenderness identified at this time. No open lesions or pre-ulcerative lesions.  No pain with calf compression, swelling, warmth, erythema  MRI Right foot 12/21/2016: IMPRESSION: 1. Mild forefoot and moderate midfoot degenerative changes. 2. No stress fracture or AVN. 3. Mild intermetatarsal bursitis between the second and third and third and fourth metatarsal heads. No findings suspicious for Morton's neuroma.  MRI Left foot 12/21/2016: IMPRESSION: 1. Findings suggest un inflammatory arthropathy involving the second MTP joint as described above. Gout  is a possibility. 2. Intermetatarsal bursitis between the first and second metatarsal heads. 3. No MR findings to suggest a Morton's neuroma. 4. Midfoot degenerative changes but no stress fracture or AVN.  Assessment: Bursitis, synovitis  Plan: -All treatment options discussed with the patient including all alternatives, risks, complications.  -MRI results were discussed the patient. After discussion about possible gout recently MRI to the left foot she has no clinical symptoms of acute gout and this is likely to be a chronic issue. She states that she was fearful that it could be gout. She does that she drinks alcohol on a regular basis which she feels may be contributing to it. -Order blood worsen including uric acid, rheumatoid factor, ESR, CRP, ANA. She's going to her primary care physician on Saturday for physical and she is contracted the blood work done then. -Order to hold off on medications until we get the blood work results. For now continue orthotics as well as supportive shoes. Ice the area. We discussed natural anti-inflammatories.  Celesta Gentile, DPM -Patient encouraged to call the office with any questions, concerns, change in symptoms.

## 2016-12-28 DIAGNOSIS — M064 Inflammatory polyarthropathy: Secondary | ICD-10-CM | POA: Diagnosis not present

## 2016-12-28 DIAGNOSIS — E559 Vitamin D deficiency, unspecified: Secondary | ICD-10-CM | POA: Diagnosis not present

## 2016-12-28 DIAGNOSIS — Z0000 Encounter for general adult medical examination without abnormal findings: Secondary | ICD-10-CM | POA: Diagnosis not present

## 2016-12-28 DIAGNOSIS — Z23 Encounter for immunization: Secondary | ICD-10-CM | POA: Diagnosis not present

## 2016-12-28 DIAGNOSIS — Z1231 Encounter for screening mammogram for malignant neoplasm of breast: Secondary | ICD-10-CM | POA: Diagnosis not present

## 2016-12-28 DIAGNOSIS — E785 Hyperlipidemia, unspecified: Secondary | ICD-10-CM | POA: Diagnosis not present

## 2016-12-28 DIAGNOSIS — Z1389 Encounter for screening for other disorder: Secondary | ICD-10-CM | POA: Diagnosis not present

## 2016-12-30 DIAGNOSIS — E039 Hypothyroidism, unspecified: Secondary | ICD-10-CM | POA: Diagnosis not present

## 2016-12-30 DIAGNOSIS — R7301 Impaired fasting glucose: Secondary | ICD-10-CM | POA: Diagnosis not present

## 2016-12-30 DIAGNOSIS — E669 Obesity, unspecified: Secondary | ICD-10-CM | POA: Diagnosis not present

## 2017-01-01 DIAGNOSIS — Z6832 Body mass index (BMI) 32.0-32.9, adult: Secondary | ICD-10-CM | POA: Diagnosis not present

## 2017-01-01 DIAGNOSIS — D518 Other vitamin B12 deficiency anemias: Secondary | ICD-10-CM | POA: Diagnosis not present

## 2017-01-01 DIAGNOSIS — E669 Obesity, unspecified: Secondary | ICD-10-CM | POA: Diagnosis not present

## 2017-01-01 DIAGNOSIS — E663 Overweight: Secondary | ICD-10-CM | POA: Diagnosis not present

## 2017-01-01 MED ORDER — DICLOFENAC SODIUM 2 % TD SOLN: TRANSDERMAL | 5 refills | Status: DC

## 2017-01-06 DIAGNOSIS — E669 Obesity, unspecified: Secondary | ICD-10-CM | POA: Diagnosis not present

## 2017-01-07 ENCOUNTER — Telehealth: Payer: Self-pay | Admitting: *Deleted

## 2017-01-07 NOTE — Telephone Encounter (Addendum)
Pt called for blood results. 01/07/2017-Called [facility] did not have the results although orders had our Acct#, was transferred to [place]., he did not find results under our Acct#. Left message requesting pt call with the lab location she had the bloodwork performed.01/08/2017-left message requesting pt have Dr. Delena Bali office fax lab results to [phone], our main fax is down.01/10/2017-I informed pt, we had received the labs from Dr. Delena Bali and would call once Dr.Wagoner had reviewed.

## 2017-01-08 NOTE — Telephone Encounter (Signed)
Hi Brenda Allen, this is Bird calling you back. I was trying to answer the phone but couldn't get to it in time. If you could give me a call back at [phone]. Thank you.

## 2017-01-08 NOTE — Telephone Encounter (Signed)
Hi Brenda Allen, this is Brenda Allen. My medical records were done through my doctor's office, Dr. Delena Bali in [place]. He has faxed them over to you and said they were faxing them a second time when I spoke to him yesterday. It was done through his lab right there in his office. He had Dr. Leigh Aurora name so he knew who to fax them to. If you could give me a call back as soon as possible because I'm having a lot of pain my foot and I want to get down to fixing it as this has been going on a long time. Please call me back at [phone].

## 2017-01-14 ENCOUNTER — Telehealth: Payer: Self-pay | Admitting: *Deleted

## 2017-01-14 NOTE — Telephone Encounter (Addendum)
-----   Message from Trula Slade, DPM sent at 01/13/2017  5:19 PM EDT ----- After review of her blood work, it does not appear to be an acute gout or underlying systemic arthritis. I think we need to work on her orthotics more for some adjustment and consider either further steroid injections or oral steroids. Please let her know. Thanks. 01/14/2017-Left message with Dr. Leigh Aurora review of labs and orders.

## 2017-01-15 ENCOUNTER — Telehealth: Payer: Self-pay | Admitting: Podiatry

## 2017-01-15 NOTE — Telephone Encounter (Signed)
I was calling because Dr. Jacqualyn Posey is wanting me to have steroid injections as well as getting my orthotics updated. I'm not thrilled about having the steroid injections because they did not help before. I saw Dr. Cannon Kettle before and she drained the fluid. I was wondering if he or Dr. Cannon Kettle would drain the fluid. You can call me back at [phone].

## 2017-01-16 NOTE — Telephone Encounter (Signed)
Yes, that is what I was wanting to do is to drain the fluid off of the 2nd toe joint on the left foot since that is where the majority of pain is. Usually we put a little steroid in there after. If she would like to do this please have her scheduled with me or Dr. Cannon Kettle. Thanks.

## 2017-01-16 NOTE — Telephone Encounter (Signed)
I informed pt of Dr. Leigh Aurora recommendation and transferred to schedulers.

## 2017-01-21 ENCOUNTER — Ambulatory Visit (INDEPENDENT_AMBULATORY_CARE_PROVIDER_SITE_OTHER): Payer: BLUE CROSS/BLUE SHIELD | Admitting: Sports Medicine

## 2017-01-21 DIAGNOSIS — M7742 Metatarsalgia, left foot: Secondary | ICD-10-CM

## 2017-01-21 DIAGNOSIS — M79671 Pain in right foot: Secondary | ICD-10-CM | POA: Diagnosis not present

## 2017-01-21 DIAGNOSIS — M2540 Effusion, unspecified joint: Secondary | ICD-10-CM

## 2017-01-21 DIAGNOSIS — M79672 Pain in left foot: Secondary | ICD-10-CM

## 2017-01-21 DIAGNOSIS — M779 Enthesopathy, unspecified: Secondary | ICD-10-CM

## 2017-01-21 DIAGNOSIS — M2042 Other hammer toe(s) (acquired), left foot: Secondary | ICD-10-CM

## 2017-01-21 DIAGNOSIS — M2041 Other hammer toe(s) (acquired), right foot: Secondary | ICD-10-CM

## 2017-01-21 DIAGNOSIS — M7741 Metatarsalgia, right foot: Secondary | ICD-10-CM

## 2017-01-21 MED ORDER — TRIAMCINOLONE ACETONIDE 10 MG/ML IJ SUSP: 10.0000 mg | Freq: Once | INTRAMUSCULAR | Status: DC

## 2017-01-21 NOTE — Progress Notes (Signed)
Subjective: Brenda Allen is a 56 y.o. female patient who presents to office for evaluation of L>R foot pain. Patient complains of progressive pain especially over the last few weeks and reports that she can barely walk. Ranks pain 9/10 and is now interferring with daily activities. Hervey Ard feels like something is broken. Patient has tried rest with no relief in symptoms. Patient denies any other pedal complaints. Denies recent injury/trip/fall/sprain/any causative factors.   Had MRIs performed last month.   There are no active problems to display for this patient.   Current Outpatient Prescriptions on File Prior to Visit  Medication Sig Dispense Refill  . DEXILANT 60 MG capsule     . Diclofenac Sodium (PENNSAID) 2 % SOLN Apply to affected area 3-4 times daily. 112 g 5  . doxycycline (MONODOX) 100 MG capsule     . halobetasol (ULTRAVATE) 0.05 % cream     . HYDROcodone-acetaminophen (VICODIN) 5-500 MG per tablet Take 0.5 tablets by mouth every 6 (six) hours as needed.    . meloxicam (MOBIC) 15 MG tablet Take 1 tablet (15 mg total) by mouth daily. 30 tablet 1  . metaxalone (SKELAXIN) 800 MG tablet     . mupirocin ointment (BACTROBAN) 2 %     . neomycin-polymyxin-hydrocortisone (CORTISPORIN) 3.5-10000-1 otic suspension     . nystatin cream (MYCOSTATIN)     . omeprazole (PRILOSEC) 20 MG capsule Take 20 mg by mouth every other day.    . phentermine 37.5 MG capsule Take 37.5 mg by mouth every morning.    Marland Kitchen rOPINIRole (REQUIP) 0.25 MG tablet Take 0.25 mg by mouth at bedtime as needed. For restless leg syndrome    . Tavaborole (KERYDIN) 5 % SOLN Apply 1 drop to each affected nail once daily for 12 months 10 mL 6  . triamcinolone (KENALOG) 0.1 % paste     . valACYclovir (VALTREX) 500 MG tablet Take 500 mg by mouth 2 (two) times daily.     Current Facility-Administered Medications on File Prior to Visit  Medication Dose Route Frequency Provider Last Rate Last Dose  . triamcinolone acetonide (KENALOG)  10 MG/ML injection 10 mg  10 mg Other Once Harriet Masson, DPM      . triamcinolone acetonide (KENALOG) 10 MG/ML injection 10 mg  10 mg Other Once Harriet Masson, DPM      . triamcinolone acetonide (KENALOG) 10 MG/ML injection 10 mg  10 mg Other Once Trula Slade, DPM        No Known Allergies  Objective:  General: Alert and oriented x3 in no acute distress  Dermatology: No open lesions bilateral lower extremities, no webspace macerations, no ecchymosis bilateral, all nails x 10 are well manicured.  Vascular: Dorsalis Pedis and Posterior Tibial pedal pulses palpable, Capillary Fill Time 3 seconds,(+) pedal hair growth bilateral, no edema bilateral lower extremities, Temperature gradient within normal limits.  Neurology: Johney Maine sensation intact via light touch bilateral. (- )Tinels sign bilateral.   Musculoskeletal: Moderate tenderness with palpation at 2nd MTPJ on left foot and mild pain to palpation at 2nd MTPJ on right with long 2nd toe and deviation ? Plantar plate involvement with hammertoe deformity,No pain with calf compression bilateral.  Strength within normal limits in all groups bilateral.   Gait: Antalgic gait  Assessment and Plan: Problem List Items Addressed This Visit    None    Visit Diagnoses    Capsulitis    -  Primary   Relevant Medications   triamcinolone acetonide (KENALOG) 10 MG/ML  injection 10 mg   Joint swelling       Metatarsalgia of left foot       Metatarsalgia, right foot       Hammertoe, bilateral       Foot pain, bilateral           -Complete examination performed -Previous MRI results were reviewed  -Previous blood work through her PCP was negative for gout per patient report -Discussed treatement options for capsulitis possible plantar plate involvement with structural hammertoe with deviation at the level of the MTPJ L>R -After oral consent and aseptic prep, injected a mixture containing 1 ml of 2%  plain lidocaine, 1 ml 0.5% plain  marcaine, 0.5 ml of kenalog 10 and 0.5 ml of dexamethasone phosphate into left and right 2nd MTPJs without complication. Post-injection care discussed with patient. Recommend rest, ice, elevation.  -Advised patient that if no better may benefit from capsulotomy, 2nd met osteotomy and hammertoe repair -Recommend good supportive shoes and custom insoles daily  -Patient to return to office as needed or sooner if condition worsens.  Landis Martins, DPM

## 2017-01-22 DIAGNOSIS — E039 Hypothyroidism, unspecified: Secondary | ICD-10-CM | POA: Diagnosis not present

## 2017-01-22 DIAGNOSIS — N951 Menopausal and female climacteric states: Secondary | ICD-10-CM | POA: Diagnosis not present

## 2017-01-22 DIAGNOSIS — E7800 Pure hypercholesterolemia, unspecified: Secondary | ICD-10-CM | POA: Diagnosis not present

## 2017-01-22 DIAGNOSIS — E669 Obesity, unspecified: Secondary | ICD-10-CM | POA: Diagnosis not present

## 2017-01-22 DIAGNOSIS — R7301 Impaired fasting glucose: Secondary | ICD-10-CM | POA: Diagnosis not present

## 2017-01-27 DIAGNOSIS — F419 Anxiety disorder, unspecified: Secondary | ICD-10-CM | POA: Diagnosis not present

## 2017-01-27 DIAGNOSIS — R454 Irritability and anger: Secondary | ICD-10-CM | POA: Diagnosis not present

## 2017-01-27 DIAGNOSIS — E039 Hypothyroidism, unspecified: Secondary | ICD-10-CM | POA: Diagnosis not present

## 2017-01-30 ENCOUNTER — Telehealth: Payer: Self-pay | Admitting: Sports Medicine

## 2017-01-30 NOTE — Telephone Encounter (Signed)
I was calling to schedule surgery on my feet, as that was the next option. If you could call me back and let me know what I need to do. My number is [phone]. Thank you.

## 2017-01-31 DIAGNOSIS — D518 Other vitamin B12 deficiency anemias: Secondary | ICD-10-CM | POA: Diagnosis not present

## 2017-01-31 DIAGNOSIS — J301 Allergic rhinitis due to pollen: Secondary | ICD-10-CM | POA: Diagnosis not present

## 2017-01-31 DIAGNOSIS — E663 Overweight: Secondary | ICD-10-CM | POA: Diagnosis not present

## 2017-01-31 DIAGNOSIS — R5383 Other fatigue: Secondary | ICD-10-CM | POA: Diagnosis not present

## 2017-02-06 ENCOUNTER — Encounter: Payer: Self-pay | Admitting: Podiatry

## 2017-02-06 ENCOUNTER — Ambulatory Visit: Payer: BLUE CROSS/BLUE SHIELD | Admitting: Podiatry

## 2017-02-06 DIAGNOSIS — M7742 Metatarsalgia, left foot: Secondary | ICD-10-CM

## 2017-02-06 DIAGNOSIS — M779 Enthesopathy, unspecified: Secondary | ICD-10-CM

## 2017-02-09 NOTE — Progress Notes (Signed)
Subjective: Brenda Allen presents the office today for follow-up evaluation of left foot pain.  She states that after the injection, aspiration that was done last appointment did help for about 1 week before pain started to come back.  She presents today to discuss surgical intervention potentially.  She states that she still has pain on an ongoing basis.  She did not bring orthotics for me to evaluate.  She denies any recent injury or trauma.  She occasionally gets swelling but no redness or warmth.  She has no other concerns. Denies any systemic complaints such as fevers, chills, nausea, vomiting. No acute changes since last appointment, and no other complaints at this time.   Objective: AAO x3, NAD DP/PT pulses palpable bilaterally, CRT less than 3 seconds There is tenderness second MTPJ on the left foot and submetatarsal 2.  There is no area pinpoint bony tenderness or pain to vibratory sensation.  There is no significant edema, erythema, increased warmth.  There is no pain on the interspaces there is no palpable neuroma identified.  There is no other area of tenderness identified at this time.  She has pain to the right foot is on the same area but not as significant as the left side.  Hammertoes present in the left second toe. No open lesions or pre-ulcerative lesions.  No pain with calf compression, swelling, warmth, erythema  Assessment: Capsulitis left foot, second MTPJ  Plan: -All treatment options discussed with the patient including all alternatives, risks, complications.  -At this point we discussed both conservative as well as surgical treatment options.  We have reviewed her old x-rays.  After long discussion we are going to try to see if we can modify her orthotic to help take pressure off the area but if not she wishes to proceed with surgical intervention.  After seeing Dr. Cannon Kettle I do agree that a second metatarsal osteotomy as well as hammertoe repair to the second digit would be  warranted however I would also do the third metatarsal osteotomy as well to help with creating normal parabola.  We discussed the surgery as well as the postoperative course.  She will consider her options. -Patient encouraged to call the office with any questions, concerns, change in symptoms.   Trula Slade DPM

## 2017-02-10 ENCOUNTER — Ambulatory Visit: Payer: BLUE CROSS/BLUE SHIELD | Admitting: Orthotics

## 2017-02-10 DIAGNOSIS — E669 Obesity, unspecified: Secondary | ICD-10-CM | POA: Diagnosis not present

## 2017-02-10 DIAGNOSIS — M7752 Other enthesopathy of left foot: Secondary | ICD-10-CM

## 2017-02-10 DIAGNOSIS — M7742 Metatarsalgia, left foot: Secondary | ICD-10-CM

## 2017-02-10 DIAGNOSIS — M2041 Other hammer toe(s) (acquired), right foot: Secondary | ICD-10-CM

## 2017-02-10 DIAGNOSIS — E039 Hypothyroidism, unspecified: Secondary | ICD-10-CM | POA: Diagnosis not present

## 2017-02-10 DIAGNOSIS — M2042 Other hammer toe(s) (acquired), left foot: Secondary | ICD-10-CM

## 2017-02-10 DIAGNOSIS — M7741 Metatarsalgia, right foot: Secondary | ICD-10-CM

## 2017-02-10 NOTE — Progress Notes (Signed)
Patient came in today for her current f/o to evaluated; she has three pair from [place] dating back to 2010.  She decided to get one pair refurbished IF [employer] did not pay for a new pair.   She was cast in case they did.

## 2017-02-18 ENCOUNTER — Telehealth: Payer: Self-pay | Admitting: Podiatry

## 2017-02-18 NOTE — Telephone Encounter (Signed)
Left message for pt to call regarding orthotic coverage.Brenda Allen

## 2017-02-18 NOTE — Telephone Encounter (Signed)
Pt returned call and has decided to hold off on new orthotics until beginning of next year but would like to go ahead and get older ones refurbished.Brenda Allen has them to send to be refurbished already

## 2017-03-06 ENCOUNTER — Encounter: Payer: BC Managed Care – PPO | Admitting: Orthotics

## 2017-03-10 ENCOUNTER — Encounter: Payer: BLUE CROSS/BLUE SHIELD | Admitting: Orthotics

## 2017-03-11 ENCOUNTER — Ambulatory Visit (INDEPENDENT_AMBULATORY_CARE_PROVIDER_SITE_OTHER): Payer: BLUE CROSS/BLUE SHIELD | Admitting: Orthotics

## 2017-03-11 DIAGNOSIS — M7742 Metatarsalgia, left foot: Secondary | ICD-10-CM

## 2017-03-11 DIAGNOSIS — M7741 Metatarsalgia, right foot: Secondary | ICD-10-CM

## 2017-03-12 NOTE — Progress Notes (Signed)
Patient came in today to pick up custom made foot orthotics.  (Refurbished). The goals were accomplished and the patient reported no dissatisfaction with said orthotics.  Patient was advised of breakin period and how to report any issues.

## 2017-03-13 ENCOUNTER — Telehealth: Payer: Self-pay | Admitting: Podiatry

## 2017-03-13 NOTE — Telephone Encounter (Signed)
Called the patient to discuss possible surgery. She states that she is doing better and wants to hold off at least until after the holidays. She got her orthotics and they have been helping some. When she comes back in we will get updated x-rays. She has no other concerns. I will see her back at her convience.   Trula Slade

## 2017-03-14 DIAGNOSIS — M722 Plantar fascial fibromatosis: Secondary | ICD-10-CM

## 2017-03-19 DIAGNOSIS — M542 Cervicalgia: Secondary | ICD-10-CM | POA: Diagnosis not present

## 2017-03-19 DIAGNOSIS — Z6834 Body mass index (BMI) 34.0-34.9, adult: Secondary | ICD-10-CM | POA: Diagnosis not present

## 2017-04-03 DIAGNOSIS — Z1231 Encounter for screening mammogram for malignant neoplasm of breast: Secondary | ICD-10-CM | POA: Diagnosis not present

## 2017-04-07 DIAGNOSIS — F419 Anxiety disorder, unspecified: Secondary | ICD-10-CM | POA: Diagnosis not present

## 2017-04-07 DIAGNOSIS — E039 Hypothyroidism, unspecified: Secondary | ICD-10-CM | POA: Diagnosis not present

## 2017-04-07 DIAGNOSIS — E669 Obesity, unspecified: Secondary | ICD-10-CM | POA: Diagnosis not present

## 2017-04-07 DIAGNOSIS — R454 Irritability and anger: Secondary | ICD-10-CM | POA: Diagnosis not present

## 2017-04-11 DIAGNOSIS — F419 Anxiety disorder, unspecified: Secondary | ICD-10-CM | POA: Diagnosis not present

## 2017-04-11 DIAGNOSIS — G479 Sleep disorder, unspecified: Secondary | ICD-10-CM | POA: Diagnosis not present

## 2017-04-11 DIAGNOSIS — N951 Menopausal and female climacteric states: Secondary | ICD-10-CM | POA: Diagnosis not present

## 2017-04-11 DIAGNOSIS — R6882 Decreased libido: Secondary | ICD-10-CM | POA: Diagnosis not present

## 2017-04-14 DIAGNOSIS — Z6833 Body mass index (BMI) 33.0-33.9, adult: Secondary | ICD-10-CM | POA: Diagnosis not present

## 2017-04-14 DIAGNOSIS — M8589 Other specified disorders of bone density and structure, multiple sites: Secondary | ICD-10-CM | POA: Diagnosis not present

## 2017-04-14 DIAGNOSIS — R7301 Impaired fasting glucose: Secondary | ICD-10-CM | POA: Diagnosis not present

## 2017-04-14 DIAGNOSIS — E785 Hyperlipidemia, unspecified: Secondary | ICD-10-CM | POA: Diagnosis not present

## 2017-04-14 DIAGNOSIS — E559 Vitamin D deficiency, unspecified: Secondary | ICD-10-CM | POA: Diagnosis not present

## 2017-05-02 DIAGNOSIS — M1612 Unilateral primary osteoarthritis, left hip: Secondary | ICD-10-CM | POA: Diagnosis not present

## 2017-05-02 DIAGNOSIS — M25552 Pain in left hip: Secondary | ICD-10-CM | POA: Diagnosis not present

## 2017-05-07 ENCOUNTER — Telehealth: Payer: Self-pay | Admitting: *Deleted

## 2017-05-07 NOTE — Telephone Encounter (Signed)
Per Theo Dills., patient called and stated she's going to postpone surgery.  She said she just got a new job.  She said she would call to reschedule it when she was ready.

## 2017-05-07 NOTE — Telephone Encounter (Signed)
Thanks

## 2017-05-12 ENCOUNTER — Ambulatory Visit: Payer: BC Managed Care – PPO | Admitting: Podiatry

## 2017-05-12 DIAGNOSIS — M1612 Unilateral primary osteoarthritis, left hip: Secondary | ICD-10-CM | POA: Diagnosis not present

## 2017-05-12 DIAGNOSIS — M25552 Pain in left hip: Secondary | ICD-10-CM | POA: Diagnosis not present

## 2017-06-18 ENCOUNTER — Telehealth: Payer: Self-pay | Admitting: *Deleted

## 2017-06-18 NOTE — Telephone Encounter (Signed)
"  My name is Brenda Allen.  I need to schedule surgery with Colvin Caroli."

## 2017-06-19 NOTE — Telephone Encounter (Signed)
I am returning your call.  You will need to schedule a consultation with Dr. Jacqualyn Posey to sign consent forms.  We will get you scheduled for surgery during that appointment.  Dr. Leigh Aurora schedule is open, you will not have a problem getting an appointment for surgery.  "Where is the surgery done at, I am not familiar with the area?"  Surgery is done at [facility].  They are located at [place]. [address].  "Okay, I'll call back tomorrow to schedule an appointment."

## 2017-06-24 ENCOUNTER — Encounter: Payer: Self-pay | Admitting: Podiatry

## 2017-06-24 ENCOUNTER — Ambulatory Visit: Payer: BLUE CROSS/BLUE SHIELD | Admitting: Podiatry

## 2017-06-24 ENCOUNTER — Ambulatory Visit (INDEPENDENT_AMBULATORY_CARE_PROVIDER_SITE_OTHER): Payer: BLUE CROSS/BLUE SHIELD

## 2017-06-24 DIAGNOSIS — M2041 Other hammer toe(s) (acquired), right foot: Secondary | ICD-10-CM | POA: Diagnosis not present

## 2017-06-24 DIAGNOSIS — M2042 Other hammer toe(s) (acquired), left foot: Secondary | ICD-10-CM

## 2017-06-24 DIAGNOSIS — M779 Enthesopathy, unspecified: Secondary | ICD-10-CM | POA: Diagnosis not present

## 2017-06-24 DIAGNOSIS — G8929 Other chronic pain: Secondary | ICD-10-CM | POA: Diagnosis not present

## 2017-06-24 DIAGNOSIS — M79673 Pain in unspecified foot: Secondary | ICD-10-CM | POA: Diagnosis not present

## 2017-06-24 NOTE — Patient Instructions (Signed)

## 2017-06-24 NOTE — Progress Notes (Signed)
Subjective: Brenda Allen presents the office today for follow-up evaluation of continued pain to both of her feet with the left side worse than the right.  She presents the office today with her husband.  She states that she is continued pain and she points mostly on second MPJs bilaterally which she has majority of symptoms.  She is attempted numerous conservative treatments including injections, anti-inflammatories, shoe changes, orthotics with any significant improvement at this point she is requesting surgical intervention to help decrease her pain.  She states that she understands this is not a guarantee but at this point she like to go ahead and proceed with this.  She also states her left fifth toes given a painful corn to the outside aspect which is painful.  She does trim the area sometimes which helps but it does come right back causing pain.  Denies any open sores. Denies any systemic complaints such as fevers, chills, nausea, vomiting. No acute changes since last appointment, and no other complaints at this time.   Objective: AAO x3, NAD DP/PT pulses palpable bilaterally, CRT less than 3 seconds There is tenderness palpation of bilateral second MTPJ's there is trace edema to this area but there is no erythema or increase in warmth.  The left side is worse than the right.  Also adductovarus is present the left fifth toe as well as a corn present the distal lateral portion of the toe which is tender with palpation.  There is no pain or crepitation with MPJ range of motion.  There is no other area pinpoint bony tenderness and there is no other area tenderness otherwise. No open lesions or pre-ulcerative lesions.  No pain with calf compression, swelling, warmth, erythema  Assessment: Capsulitis bilateral second MPJs, adductovarus left fifth toe  Plan: -All treatment options discussed with the patient including all alternatives, risks, complications.  -Repeat x-rays were obtained and reviewed  bilaterally.  There is to be elongated second and third metatarsals bilaterally.  Moderate arthritic changes present the second metatarsal head of the left foot.  No evidence of acute fracture. -We have a long discussion today in regards to both conservative as well as surgical treatment options.  She was to proceed with surgical intervention.  We discussed the shortening osteotomy of the second metatarsal.  If we do that we will likely to shorten the third to create a normal parabola.  Also she wants to have surgery in the left fifth toe to help get rid of the point.  Discussed the arthroplasty of the fifth digit.  She would like to proceed with surgery bilaterally.  We discussed that this is going to be difficult for her but after discussion about this she is adamant about going ahead and proceed with bilateral surgery. -We will plan for second and third metatarsal osteotomies bilaterally as well as fifth digit arthroplasty of the left foot.  We discussed this is not a guarantee of resolution of symptoms and she could continue to have pain or worsening of pain after the surgery. -The incision placement as well as the postoperative course was discussed with the patient. I discussed risks of the surgery which include, but not limited to, infection, bleeding, pain, swelling, need for further surgery, delayed or nonhealing, painful or ugly scar, numbness or sensation changes, over/under correction, recurrence, transfer lesions, further deformity, hardware failure, DVT/PE, loss of toe/foot. Patient understands these risks and wishes to proceed with surgery. The surgical consent was reviewed with the patient all 3 pages were signed. No  promises or guarantees were given to the outcome of the procedure. All questions were answered to the best of my ability. Before the surgery the patient was encouraged to call the office if there is any further questions. The surgery will be performed at the [facility] on an outpatient  basis. -Patient encouraged to call the office with any questions, concerns, change in symptoms.   Trula Slade DPM

## 2017-06-30 ENCOUNTER — Ambulatory Visit: Payer: BLUE CROSS/BLUE SHIELD | Admitting: Podiatry

## 2017-06-30 DIAGNOSIS — D3610 Benign neoplasm of peripheral nerves and autonomic nervous system, unspecified: Secondary | ICD-10-CM | POA: Diagnosis not present

## 2017-06-30 NOTE — Progress Notes (Signed)
Subjective: Brenda Allen presents the office today she has several questions of the abdomen surgery.  After I saw her she states that she was talking to some friends and she is doing a lot of research online and she is concerned that we may be missing something and she does not want to proceed with surgery if she feels there is something else going on with her foot.  She has several questions about the surgery.  Overall her exam is unchanged. Denies any systemic complaints such as fevers, chills, nausea, vomiting. No acute changes since last appointment, and no other complaints at this time.   Objective: AAO x3, NAD DP/PT pulses palpable bilaterally, CRT less than 3 seconds There is tenderness palpation of bilateral second MTPJ's there is trace edema to this area but there is no erythema or increase in warmth.  The left side is worse than the right.  Also adductovarus is present the left fifth toe as well as a corn present the distal lateral portion of the toe which is tender with palpation.  There is no pain or crepitation with MPJ range of motion.  There is no other area pinpoint bony tenderness and there is no other area tenderness otherwise. No open lesions or pre-ulcerative lesions.  No pain with calf compression, swelling, warmth, erythema  Assessment: Capsulitis bilateral second MPJs, adductovarus left fifth toe  Plan: -All treatment options discussed with the patient including all alternatives, risks, complications. -She is very unsure about having the surgery.  We had a discussion regards to this and I want her to be comfortable with the decision that we make.  Because of this and the going to cancel the surgery for this Wednesday.  We discussed possible neuroma however the MRI did not pick this up. I ordered a diagnostic ultrasound to evaluate the interspaces bilaterally to see if there is neuroma before proceeding with surgery.  Also after the ultrasound will we discussed the surgery.  Also  discussed a second opinion but she does not not want to do that at this point she states that she wants me to do the surgery but discussed that I am happy to have her see someone else for second opinion should she want to. -I will see her back after the ultrasound or sooner if needed. Encouraged her to send  "mychart" messages or call the office should she have any concerns.   Trula Slade DPM

## 2017-07-01 ENCOUNTER — Other Ambulatory Visit: Payer: Self-pay

## 2017-07-01 ENCOUNTER — Other Ambulatory Visit: Payer: Self-pay | Admitting: *Deleted

## 2017-07-02 ENCOUNTER — Other Ambulatory Visit: Payer: Self-pay

## 2017-07-02 DIAGNOSIS — D3610 Benign neoplasm of peripheral nerves and autonomic nervous system, unspecified: Secondary | ICD-10-CM

## 2017-07-02 NOTE — Progress Notes (Signed)
error 

## 2017-07-02 NOTE — Progress Notes (Signed)
- 

## 2017-07-04 ENCOUNTER — Other Ambulatory Visit: Payer: Self-pay

## 2017-07-04 DIAGNOSIS — D3610 Benign neoplasm of peripheral nerves and autonomic nervous system, unspecified: Secondary | ICD-10-CM

## 2017-07-07 ENCOUNTER — Encounter: Payer: BLUE CROSS/BLUE SHIELD | Admitting: Podiatry

## 2017-07-14 ENCOUNTER — Encounter: Payer: BLUE CROSS/BLUE SHIELD | Admitting: Podiatry

## 2017-07-15 ENCOUNTER — Ambulatory Visit
Admission: RE | Admit: 2017-07-15 | Discharge: 2017-07-15 | Disposition: A | Discharge status: Home / Self Care | Payer: BLUE CROSS/BLUE SHIELD | Source: Ambulatory Visit | Attending: Podiatry | Admitting: Podiatry

## 2017-07-15 DIAGNOSIS — D3610 Benign neoplasm of peripheral nerves and autonomic nervous system, unspecified: Secondary | ICD-10-CM

## 2017-07-15 DIAGNOSIS — M79671 Pain in right foot: Secondary | ICD-10-CM | POA: Diagnosis not present

## 2017-07-15 DIAGNOSIS — M79672 Pain in left foot: Secondary | ICD-10-CM | POA: Diagnosis not present

## 2017-07-27 ENCOUNTER — Encounter: Payer: Self-pay | Admitting: Podiatry

## 2017-07-28 ENCOUNTER — Encounter: Payer: Self-pay | Admitting: Podiatry

## 2017-07-29 ENCOUNTER — Telehealth: Payer: Self-pay | Admitting: *Deleted

## 2017-07-29 DIAGNOSIS — M8589 Other specified disorders of bone density and structure, multiple sites: Secondary | ICD-10-CM | POA: Diagnosis not present

## 2017-07-29 DIAGNOSIS — E538 Deficiency of other specified B group vitamins: Secondary | ICD-10-CM | POA: Diagnosis not present

## 2017-07-29 DIAGNOSIS — R7301 Impaired fasting glucose: Secondary | ICD-10-CM | POA: Diagnosis not present

## 2017-07-29 DIAGNOSIS — E785 Hyperlipidemia, unspecified: Secondary | ICD-10-CM | POA: Diagnosis not present

## 2017-07-29 DIAGNOSIS — E559 Vitamin D deficiency, unspecified: Secondary | ICD-10-CM | POA: Diagnosis not present

## 2017-07-29 MED ORDER — DICLOFENAC SODIUM 2 % TD SOLN: TRANSDERMAL | 5 refills | Status: DC

## 2017-07-29 NOTE — Telephone Encounter (Signed)
Refill request a for Pennsaid 2% pump. Dr. Jacqualyn Posey states refill as previously.

## 2017-08-06 ENCOUNTER — Other Ambulatory Visit: Payer: Self-pay | Admitting: Podiatry

## 2017-08-06 ENCOUNTER — Encounter: Payer: Self-pay | Admitting: Podiatry

## 2017-08-06 DIAGNOSIS — M2042 Other hammer toe(s) (acquired), left foot: Secondary | ICD-10-CM | POA: Diagnosis not present

## 2017-08-06 DIAGNOSIS — M216X2 Other acquired deformities of left foot: Secondary | ICD-10-CM | POA: Diagnosis not present

## 2017-08-06 DIAGNOSIS — K219 Gastro-esophageal reflux disease without esophagitis: Secondary | ICD-10-CM | POA: Diagnosis not present

## 2017-08-06 DIAGNOSIS — M21542 Acquired clubfoot, left foot: Secondary | ICD-10-CM | POA: Diagnosis not present

## 2017-08-06 MED ORDER — PROMETHAZINE HCL 25 MG PO TABS: 25.0000 mg | ORAL_TABLET | Freq: Three times a day (TID) | ORAL | 0 refills | Status: DC | PRN

## 2017-08-06 MED ORDER — OXYCODONE-ACETAMINOPHEN 5-325 MG PO TABS: 1.0000 | ORAL_TABLET | Freq: Four times a day (QID) | ORAL | 0 refills | Status: DC | PRN

## 2017-08-06 MED ORDER — CEPHALEXIN 500 MG PO CAPS: 500.0000 mg | ORAL_CAPSULE | Freq: Three times a day (TID) | ORAL | 0 refills | Status: DC

## 2017-08-08 ENCOUNTER — Telehealth: Payer: Self-pay | Admitting: *Deleted

## 2017-08-08 NOTE — Telephone Encounter (Signed)
Called and spoke with the patient and the patient is doing well and is sleeping a lot and ice and elevating and it is painful at times and I am taken my medicine as well as ibuprofen and I have taken the boot off some and I stated to ice the back of the knee and just be careful when the boot is off and put the boot on when getting up and if any concerns or questions to call the office. Brenda Allen

## 2017-08-11 ENCOUNTER — Ambulatory Visit (INDEPENDENT_AMBULATORY_CARE_PROVIDER_SITE_OTHER): Payer: BLUE CROSS/BLUE SHIELD | Admitting: Podiatry

## 2017-08-11 ENCOUNTER — Encounter: Payer: Self-pay | Admitting: Podiatry

## 2017-08-11 ENCOUNTER — Ambulatory Visit (INDEPENDENT_AMBULATORY_CARE_PROVIDER_SITE_OTHER): Payer: BLUE CROSS/BLUE SHIELD

## 2017-08-11 ENCOUNTER — Encounter: Payer: BLUE CROSS/BLUE SHIELD | Admitting: Podiatry

## 2017-08-11 VITALS — Temp 97.9°F

## 2017-08-11 DIAGNOSIS — M2042 Other hammer toe(s) (acquired), left foot: Secondary | ICD-10-CM | POA: Diagnosis not present

## 2017-08-11 DIAGNOSIS — M779 Enthesopathy, unspecified: Secondary | ICD-10-CM

## 2017-08-11 DIAGNOSIS — Z9889 Other specified postprocedural states: Secondary | ICD-10-CM

## 2017-08-11 NOTE — Progress Notes (Signed)
Subjective: Brenda Allen is a 57 y.o. is seen today in office s/p second digit hammertoe repair, second and third metatarsal osteotomy fifth digit hammertoe repair preformed on 08/06/2017. They state their pain is much improved.  She states that on Friday she is having quite a bit of pain but that pain has subsided.  She has been wearing the surgical boot.  She can try keep her foot iced and elevated much as possible. Denies any systemic complaints such as fevers, chills, nausea, vomiting. No calf pain, chest pain, shortness of breath.   Objective: General: No acute distress, AAOx3  DP/PT pulses palpable 2/4, CRT < 3 sec to all digits.  Protective sensation intact. Motor function intact.  Left foot: Incision is well coapted without any evidence of dehiscence and sutures are intact.  K wire also top of the second digit.  The toes are in rectus position.  There is minimal swelling to the area there is no surrounding erythema, ascending cellulitis.  There is no clinical signs of infection there is no drainage or pus coming from the area.  There is no malodor.  She states that she is very happy with the outcome of the surgery so far No other areas of tenderness to bilateral lower extremities.  No other open lesions or pre-ulcerative lesions.  No pain with calf compression, swelling, warmth, erythema.   Assessment and Plan:  Status post left foot surgery, doing well with no complications   -Treatment options discussed including all alternatives, risks, and complications -X-rays were obtained and reviewed.  Hardware intact.  No evidence of acute fracture. -Antibiotic ointment and a bandage was applied.  Keep the dressing clean, dry, intact. -Continue cam boot at all times.  Ice elevation.  Limit weightbearing. -Pain medication as needed. -Monitor for any clinical signs or symptoms of infection and DVT/PE and directed to call the office immediately should any occur or go to the ER. -Follow-up in 1 week  or sooner if any problems arise. In the meantime, encouraged to call the office with any questions, concerns, change in symptoms.   Celesta Gentile, DPM

## 2017-08-13 ENCOUNTER — Telehealth: Payer: Self-pay | Admitting: Podiatry

## 2017-08-13 ENCOUNTER — Telehealth: Payer: Self-pay | Admitting: *Deleted

## 2017-08-13 NOTE — Telephone Encounter (Signed)
Patient called and had a question. Didn't specify what it was about. If you can call her back at [phone].

## 2017-08-13 NOTE — Telephone Encounter (Signed)
Patient called and wanted to ask a question and I called the patient back and the patient just wanted to know could she not use the crutches and per Dr Jacqualyn Posey can stop using the crutches and I stated to keep boot on if up and about and ice and elevate as much as possible and to call the [facility] office if any concerns or questions. Brenda Allen

## 2017-08-14 ENCOUNTER — Encounter: Payer: BLUE CROSS/BLUE SHIELD | Admitting: Podiatry

## 2017-08-18 ENCOUNTER — Encounter: Payer: BLUE CROSS/BLUE SHIELD | Admitting: Podiatry

## 2017-08-28 ENCOUNTER — Ambulatory Visit (INDEPENDENT_AMBULATORY_CARE_PROVIDER_SITE_OTHER): Payer: BLUE CROSS/BLUE SHIELD

## 2017-08-28 ENCOUNTER — Ambulatory Visit (INDEPENDENT_AMBULATORY_CARE_PROVIDER_SITE_OTHER): Payer: BLUE CROSS/BLUE SHIELD | Admitting: Podiatry

## 2017-08-28 DIAGNOSIS — M779 Enthesopathy, unspecified: Secondary | ICD-10-CM

## 2017-08-28 DIAGNOSIS — Z9889 Other specified postprocedural states: Secondary | ICD-10-CM

## 2017-08-28 DIAGNOSIS — M2042 Other hammer toe(s) (acquired), left foot: Secondary | ICD-10-CM

## 2017-08-28 MED ORDER — DICLOFENAC SODIUM 2 % TD SOLN: TRANSDERMAL | 5 refills | Status: DC

## 2017-08-31 NOTE — Progress Notes (Signed)
Subjective: Brenda Allen is a 57 y.o. is seen today in office s/p second digit hammertoe repair, second and third metatarsal osteotomy fifth digit hammertoe repair preformed on 08/06/2017.  She presents today wearing a regular sandal.  She also admits that she has been going to the gym and been active although she does state that she tries to limit the amount of weight she is putting to her foot.  She has not been wearing the boot.  She also states that she hit her pain when she lost the pain.  She states the foot feels great and she is having no pain she cannot believe how good her foot feels. Denies any systemic complaints such as fevers, chills, nausea, vomiting. No calf pain, chest pain, shortness of breath.   Objective: General: No acute distress, AAOx3  DP/PT pulses palpable 2/4, CRT < 3 sec to all digits.  Protective sensation intact. Motor function intact.  Left foot: Incision is well coapted without any evidence of dehiscence and sutures are intact.  K wire also top of the second digit.  The toes are in rectus position.  There is minimal swelling to the area there is no surrounding erythema, ascending cellulitis.  There is no clinical signs of infection.  There is no malodor.   No other areas of tenderness.   No other open lesions or pre-ulcerative lesions.  No pain with calf compression, swelling, warmth, erythema.   Assessment and Plan:  Status post left foot surgery, doing well with no complications   -Treatment options discussed including all alternatives, risks, and complications -X-rays were obtained and reviewed.  Hardware intact.  No evidence of acute fracture. -I removed some of the sutures today but given her activity level there is some minimal motion across the incision of the left the majority of them intact today.  Antibiotic ointment and a bandage was applied.  I recommend her not to get the area wet although she states that she did shower and get the area wet today.  Discussed  this increases her risk of infection. -Continue CAM boot at all times.  Ice elevation.  Limit weightbearing.  Stressed the importance of this. -Pain medication as needed. -Monitor for any clinical signs or symptoms of infection and DVT/PE and directed to call the office immediately should any occur or go to the ER. -Follow-up in 1 week or sooner if any problems arise. In the meantime, encouraged to call the office with any questions, concerns, change in symptoms.   Celesta Gentile, DPM

## 2017-09-04 ENCOUNTER — Ambulatory Visit (INDEPENDENT_AMBULATORY_CARE_PROVIDER_SITE_OTHER): Payer: Self-pay | Admitting: Podiatry

## 2017-09-04 DIAGNOSIS — M2042 Other hammer toe(s) (acquired), left foot: Secondary | ICD-10-CM

## 2017-09-04 DIAGNOSIS — M7742 Metatarsalgia, left foot: Secondary | ICD-10-CM

## 2017-09-07 NOTE — Progress Notes (Signed)
Subjective: Brenda Allen is a 57 y.o. is seen today in office s/p second digit hammertoe repair, second and third metatarsal osteotomy fifth digit hammertoe repair preformed on 08/06/2017.  She states that she is doing well and she has no pain.  She is eager to get back into regular shoe and get the pin out.  She has no pain.  She presents today for possible suture removal.  She does state that she has been wearing surgical shoe more since I last saw her.  She has no other concerns today.  She presents today wearing a regular sandal.  She states that she is also going to gym once but she is only been doing upper body.  Denies any systemic complaints such as fevers, chills, nausea, vomiting. No calf pain, chest pain, shortness of breath.   Objective: General: No acute distress, AAOx3  DP/PT pulses palpable 2/4, CRT < 3 sec to all digits.  Protective sensation intact. Motor function intact.  Left foot: Incision is well coapted without any evidence of dehiscence and sutures are intact.  K wire also top of the second digit.  There is no drainage or swelling to the wire site.  The toes are in rectus position.  There is minimal and improved swelling to the area there is no surrounding erythema, ascending cellulitis.  There is no clinical signs of infection.  There is no malodor.   No other areas of tenderness.   No other open lesions or pre-ulcerative lesions.  No pain with calf compression, swelling, warmth, erythema.   Assessment and Plan:  Status post left foot surgery, doing well with no complications   -Treatment options discussed including all alternatives, risks, and complications -I took out some of the sutures today and left all but a couple intact the second toe.  There is some mild motion across the incision.  Also of the K wire intact today.  I want her to continue with surgical shoe at all times.  I applied antibiotic ointment and a bandage daily.  Hold off getting area wet but she does state  that she get the area wet again today.  Continue ice elevate.  Pain medication as needed but she has not been needing this. -Monitor for any clinical signs or symptoms of infection and DVT/PE and directed to call the office immediately should any occur or go to the ER. -Follow-up in 1 week or sooner if any problems arise. In the meantime, encouraged to call the office with any questions, concerns, change in symptoms.   Celesta Gentile, DPM

## 2017-09-10 ENCOUNTER — Telehealth: Payer: Self-pay | Admitting: Gastroenterology

## 2017-09-10 MED ORDER — DEXLANSOPRAZOLE 60 MG PO CPDR
60.0000 mg | DELAYED_RELEASE_CAPSULE | Freq: Every day | ORAL | 1 refills | Status: DC
Start: 2017-09-10 — End: 2017-10-17

## 2017-09-10 NOTE — Telephone Encounter (Signed)
Sent refill to patients pharmacy. 

## 2017-09-11 ENCOUNTER — Ambulatory Visit (INDEPENDENT_AMBULATORY_CARE_PROVIDER_SITE_OTHER): Payer: BLUE CROSS/BLUE SHIELD

## 2017-09-11 ENCOUNTER — Ambulatory Visit (INDEPENDENT_AMBULATORY_CARE_PROVIDER_SITE_OTHER): Payer: BLUE CROSS/BLUE SHIELD | Admitting: Podiatry

## 2017-09-11 ENCOUNTER — Encounter: Payer: Self-pay | Admitting: Podiatry

## 2017-09-11 DIAGNOSIS — Z9889 Other specified postprocedural states: Secondary | ICD-10-CM

## 2017-09-11 DIAGNOSIS — M2042 Other hammer toe(s) (acquired), left foot: Secondary | ICD-10-CM

## 2017-09-11 DIAGNOSIS — M7742 Metatarsalgia, left foot: Secondary | ICD-10-CM

## 2017-09-16 NOTE — Progress Notes (Signed)
Subjective: Brenda Allen is a 57 y.o. is seen today in office s/p second digit hammertoe repair, second and third metatarsal osteotomy fifth digit hammertoe repair preformed on 08/06/2017.  She states that overall she feels very well and she is not having any pain.  She states that she is ready to get the water out of the second toe as well as get the stitches out.  She does admit that she has gone to the gym since I last saw her but she states that she tries only do upper body.  She has been wearing a surgical shoe.  Denies any systemic complaints such as fevers, chills, nausea, vomiting. No calf pain, chest pain, shortness of breath.   Objective: General: No acute distress, AAOx3  DP/PT pulses palpable 2/4, CRT < 3 sec to all digits.  Protective sensation intact. Motor function intact.  Left foot: Incision is well coapted without any evidence of dehiscence and sutures are intact to the second digit.  K wire also top of the second digit.  There is no drainage or swelling to the wire site.  The toes are in rectus position.  Minimal swelling is present this is mostly to the second toe.  There is no erythema or increase in warmth or any drainage or pus.  There is no clinical signs of infection.  There is no malodor.   No other areas of tenderness.   No other open lesions or pre-ulcerative lesions.  No pain with calf compression, swelling, warmth, erythema.   Assessment and Plan:  Status post left foot surgery, doing well with no complications   -Treatment options discussed including all alternatives, risks, and complications -X-rays were obtained and reviewed.  No evidence of acute fracture identified.  Hardware intact. -The K wires removed in total without any complications.  I also remove the remainder of the stitches the second toe.  Antibiotic ointment and a bandage was applied.  With this on the next 2 days and she can take it off and start to shower.  Apply antibiotic ointment and a bandage along  the incision daily.  Remain in surgical shoe at all times.  Continue ice elevate.  Pain medication as needed although she is accompanied to this.  Recommend her to hold off on going to the gym or any other activities over the stress of the foot. -Monitor for any clinical signs or symptoms of infection and DVT/PE and directed to call the office immediately should any occur or go to the ER. -Follow-up as scheduled or sooner if any problems arise. In the meantime, encouraged to call the office with any questions, concerns, change in symptoms.   Celesta Gentile, DPM

## 2017-09-26 ENCOUNTER — Ambulatory Visit (INDEPENDENT_AMBULATORY_CARE_PROVIDER_SITE_OTHER): Payer: BLUE CROSS/BLUE SHIELD

## 2017-09-26 ENCOUNTER — Encounter: Payer: Self-pay | Admitting: Podiatry

## 2017-09-26 ENCOUNTER — Ambulatory Visit (INDEPENDENT_AMBULATORY_CARE_PROVIDER_SITE_OTHER): Payer: BLUE CROSS/BLUE SHIELD | Admitting: Podiatry

## 2017-09-26 DIAGNOSIS — M2042 Other hammer toe(s) (acquired), left foot: Secondary | ICD-10-CM

## 2017-09-26 NOTE — Progress Notes (Signed)
Subjective: Brenda Allen is a 57 y.o. is seen today in office s/p second digit hammertoe repair, second and third metatarsal osteotomy fifth digit hammertoe repair preformed on 08/06/2017.  She states that she is doing great she is been going to gym and she is been walking in shoe without any pain.  She states incisions are also healing well and eschar is formed and she has been keeping some cream and moisturizer on the scars try to break up the scar tissue.  She has no concerns today.  She has no other concerns at all. She states that she is very pleased with the surgery. Denies any systemic complaints such as fevers, chills, nausea, vomiting. No calf pain, chest pain, shortness of breath.   Objective: General: No acute distress, AAOx3  DP/PT pulses palpable 2/4, CRT < 3 sec to all digits.  Protective sensation intact. Motor function intact.  Left foot: Incision is well coapted without any evidence of dehiscence and scars are well formed.  The toes are in rectus position.  Previously the second toe is sitting slightly dorsiflexed however she is been using a splint and this is in a more rectus position.  There is no tenderness palpation the surgical sites.  There is minimal swelling there is no erythema or increase in warmth.  There is no signs of infection.  There is no pain in the surgical site. No other open lesions or pre-ulcerative lesions.  No pain with calf compression, swelling, warmth, erythema.   Assessment and Plan:  Status post left foot surgery, doing well with no complications   -Treatment options discussed including all alternatives, risks, and complications -X-rays were obtained reviewed.  Hardware intact the second third metatarsals without any evidence of acute fracture. -At this time she is doing well she is been no pain.  She is been wearing a regular shoe which is been going to the gym.  I want her to gradually increase activity.  Continue ice after activity.  Continue moisturizer  to the scar to help with this although it is doing better.  At this point she is doing well having no pain with discharge her from the postoperative care and she agrees with this plan.  However I encouraged her to call any questions or concerns any changes in symptoms  Trula Slade DPM

## 2017-10-08 DIAGNOSIS — R5383 Other fatigue: Secondary | ICD-10-CM | POA: Diagnosis not present

## 2017-10-08 DIAGNOSIS — D518 Other vitamin B12 deficiency anemias: Secondary | ICD-10-CM | POA: Diagnosis not present

## 2017-10-09 DIAGNOSIS — M1711 Unilateral primary osteoarthritis, right knee: Secondary | ICD-10-CM | POA: Diagnosis not present

## 2017-10-13 DIAGNOSIS — H1032 Unspecified acute conjunctivitis, left eye: Secondary | ICD-10-CM | POA: Diagnosis not present

## 2017-10-13 DIAGNOSIS — F329 Major depressive disorder, single episode, unspecified: Secondary | ICD-10-CM | POA: Diagnosis not present

## 2017-10-13 DIAGNOSIS — Z6833 Body mass index (BMI) 33.0-33.9, adult: Secondary | ICD-10-CM | POA: Diagnosis not present

## 2017-10-16 ENCOUNTER — Encounter: Payer: Self-pay | Admitting: Gastroenterology

## 2017-10-17 ENCOUNTER — Encounter: Payer: Self-pay | Admitting: Gastroenterology

## 2017-10-17 ENCOUNTER — Ambulatory Visit (INDEPENDENT_AMBULATORY_CARE_PROVIDER_SITE_OTHER): Payer: BLUE CROSS/BLUE SHIELD | Admitting: Gastroenterology

## 2017-10-17 VITALS — BP 126/76 | HR 60 | Ht 64.0 in | Wt 197.1 lb

## 2017-10-17 DIAGNOSIS — K219 Gastro-esophageal reflux disease without esophagitis: Secondary | ICD-10-CM | POA: Diagnosis not present

## 2017-10-17 MED ORDER — DEXLANSOPRAZOLE 60 MG PO CPDR: 60.0000 mg | DELAYED_RELEASE_CAPSULE | Freq: Every day | ORAL | 11 refills | Status: DC
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
## 2017-10-17 NOTE — Patient Instructions (Signed)
If you are age 58 or older, your body mass index should be between 23-30. Your Body mass index is 33.84 kg/m. If this is out of the aforementioned range listed, please consider follow up with your Primary Care Provider.  If you are age 3 or younger, your body mass index should be between 19-25. Your Body mass index is 33.84 kg/m. If this is out of the aformentioned range listed, please consider follow up with your Primary Care Provider.   We have sent the following medications to your pharmacy for you to pick up at your convenience: Dexilant 60 mg once daily.  You will be due for a recall colonoscopy in 03/2020. We will send you a reminder in the mail when it gets closer to that time.   Thank you,  Dr. Jackquline Denmark

## 2017-10-24 DIAGNOSIS — R6882 Decreased libido: Secondary | ICD-10-CM | POA: Diagnosis not present

## 2017-10-24 DIAGNOSIS — N951 Menopausal and female climacteric states: Secondary | ICD-10-CM | POA: Diagnosis not present

## 2017-10-24 DIAGNOSIS — F419 Anxiety disorder, unspecified: Secondary | ICD-10-CM | POA: Diagnosis not present

## 2017-10-27 DIAGNOSIS — G479 Sleep disorder, unspecified: Secondary | ICD-10-CM | POA: Diagnosis not present

## 2017-10-27 DIAGNOSIS — N951 Menopausal and female climacteric states: Secondary | ICD-10-CM | POA: Diagnosis not present

## 2017-10-27 DIAGNOSIS — L709 Acne, unspecified: Secondary | ICD-10-CM | POA: Diagnosis not present

## 2017-10-27 DIAGNOSIS — F419 Anxiety disorder, unspecified: Secondary | ICD-10-CM | POA: Diagnosis not present

## 2017-11-07 DIAGNOSIS — E6609 Other obesity due to excess calories: Secondary | ICD-10-CM | POA: Diagnosis not present

## 2017-11-24 DIAGNOSIS — M1711 Unilateral primary osteoarthritis, right knee: Secondary | ICD-10-CM | POA: Diagnosis not present

## 2017-12-03 DIAGNOSIS — E6609 Other obesity due to excess calories: Secondary | ICD-10-CM | POA: Diagnosis not present

## 2017-12-15 DIAGNOSIS — M1711 Unilateral primary osteoarthritis, right knee: Secondary | ICD-10-CM | POA: Diagnosis not present

## 2017-12-17 ENCOUNTER — Encounter: Payer: Self-pay | Admitting: Gastroenterology

## 2017-12-25 DIAGNOSIS — M1711 Unilateral primary osteoarthritis, right knee: Secondary | ICD-10-CM | POA: Diagnosis not present

## 2017-12-29 DIAGNOSIS — M25561 Pain in right knee: Secondary | ICD-10-CM | POA: Diagnosis not present

## 2017-12-30 DIAGNOSIS — M1711 Unilateral primary osteoarthritis, right knee: Secondary | ICD-10-CM | POA: Diagnosis not present

## 2017-12-31 ENCOUNTER — Telehealth: Payer: Self-pay | Admitting: *Deleted

## 2017-12-31 ENCOUNTER — Other Ambulatory Visit: Payer: Self-pay | Admitting: Podiatry

## 2017-12-31 NOTE — Telephone Encounter (Signed)
Refill request from pharmacy for Pennsaid. Dr. Berton Lan refill, needs an appt prior to future refills.

## 2017-12-31 NOTE — Telephone Encounter (Signed)
Faxed rx to [facility].

## 2018-01-08 ENCOUNTER — Other Ambulatory Visit: Payer: Self-pay | Admitting: Podiatry

## 2018-01-08 ENCOUNTER — Other Ambulatory Visit: Payer: Self-pay

## 2018-01-08 ENCOUNTER — Ambulatory Visit (AMBULATORY_SURGERY_CENTER): Payer: BLUE CROSS/BLUE SHIELD

## 2018-01-08 VITALS — Ht 66.0 in | Wt 198.4 lb

## 2018-01-08 DIAGNOSIS — K219 Gastro-esophageal reflux disease without esophagitis: Secondary | ICD-10-CM

## 2018-01-08 NOTE — Progress Notes (Signed)
No egg or soy allergy known to patient  No issues with past sedation with any surgeries  or procedures, no intubation problems  Pt taking Phentermine advised to stop taking 10 days prior. No home 02 use per patient  No blood thinners per patient  Pt denies issues with constipation  No A fib or A flutter  EMMI video sent to pt's e mail , pt declined

## 2018-01-09 ENCOUNTER — Encounter: Payer: Self-pay | Admitting: Gastroenterology

## 2018-01-13 DIAGNOSIS — K529 Noninfective gastroenteritis and colitis, unspecified: Secondary | ICD-10-CM | POA: Diagnosis not present

## 2018-01-19 ENCOUNTER — Telehealth: Payer: Self-pay | Admitting: Gastroenterology

## 2018-01-19 DIAGNOSIS — J029 Acute pharyngitis, unspecified: Secondary | ICD-10-CM | POA: Diagnosis not present

## 2018-01-19 DIAGNOSIS — J208 Acute bronchitis due to other specified organisms: Secondary | ICD-10-CM | POA: Diagnosis not present

## 2018-01-19 DIAGNOSIS — J019 Acute sinusitis, unspecified: Secondary | ICD-10-CM | POA: Diagnosis not present

## 2018-01-19 NOTE — Telephone Encounter (Signed)
Should be all right Unless she starts having more cough or starts running fever. Then we can always reschedule.

## 2018-01-19 NOTE — Telephone Encounter (Signed)
Spoke with pt and she is aware.

## 2018-01-19 NOTE — Telephone Encounter (Signed)
Pt has been diagnosed with bronchitis. She is not running a fever. She was given a Rocephin injection, kenalog injection, and prescriptions for Prednisone and Levaquin. Pt calling wanting to know if ok to proceed with EGD as scheduled later this week. Please advise.

## 2018-01-23 ENCOUNTER — Encounter: Payer: Self-pay | Admitting: Gastroenterology

## 2018-01-23 ENCOUNTER — Ambulatory Visit (AMBULATORY_SURGERY_CENTER): Payer: BLUE CROSS/BLUE SHIELD | Admitting: Gastroenterology

## 2018-01-23 VITALS — BP 139/91 | HR 66 | Temp 98.0°F | Resp 26 | Ht 66.0 in | Wt 198.0 lb

## 2018-01-23 DIAGNOSIS — K317 Polyp of stomach and duodenum: Secondary | ICD-10-CM | POA: Diagnosis not present

## 2018-01-23 DIAGNOSIS — K222 Esophageal obstruction: Secondary | ICD-10-CM

## 2018-01-23 DIAGNOSIS — K210 Gastro-esophageal reflux disease with esophagitis: Secondary | ICD-10-CM | POA: Diagnosis not present

## 2018-01-23 DIAGNOSIS — K3189 Other diseases of stomach and duodenum: Secondary | ICD-10-CM | POA: Diagnosis not present

## 2018-01-23 DIAGNOSIS — K219 Gastro-esophageal reflux disease without esophagitis: Secondary | ICD-10-CM | POA: Diagnosis not present

## 2018-01-23 DIAGNOSIS — K2970 Gastritis, unspecified, without bleeding: Secondary | ICD-10-CM

## 2018-01-23 MED ORDER — SODIUM CHLORIDE 0.9 % IV SOLN: 500.0000 mL | Freq: Once | INTRAVENOUS | Status: DC

## 2018-01-23 NOTE — Progress Notes (Signed)
Called to room to assist during endoscopic procedure.  Patient ID and intended procedure confirmed with present staff. Received instructions for my participation in the procedure from the performing physician.  

## 2018-01-23 NOTE — Progress Notes (Signed)
Report to PACU, RN, vss, BBS= Clear.  

## 2018-01-23 NOTE — Patient Instructions (Signed)
YOU HAD AN ENDOSCOPIC PROCEDURE TODAY AT [facility]:   Refer to the procedure report that was given to you for any specific questions about what was found during the examination.  If the procedure report does not answer your questions, please call your gastroenterologist to clarify.  If you requested that your care partner not be given the details of your procedure findings, then the procedure report has been included in a sealed envelope for you to review at your convenience later.  YOU SHOULD EXPECT: Some feelings of bloating in the abdomen. Passage of more gas than usual.  Walking can help get rid of the air that was put into your GI tract during the procedure and reduce the bloating. If you had a lower endoscopy (such as a colonoscopy or flexible sigmoidoscopy) you may notice spotting of blood in your stool or on the toilet paper. If you underwent a bowel prep for your procedure, you may not have a normal bowel movement for a few days.  Please Note:  You might notice some irritation and congestion in your nose or some drainage.  This is from the oxygen used during your procedure.  There is no need for concern and it should clear up in a day or so.  SYMPTOMS TO REPORT IMMEDIATELY:    Following upper endoscopy (EGD)  Vomiting of blood or coffee ground material  New chest pain or pain under the shoulder blades  Painful or persistently difficult swallowing  New shortness of breath  Fever of 100F or higher  Black, tarry-looking stools  For urgent or emergent issues, a gastroenterologist can be reached at any hour by calling [phone].   DIET:  We do recommend a small meal at first, but then you may proceed to your regular diet.  Drink plenty of fluids but you should avoid alcoholic beverages for 24 hours.  ACTIVITY:  You should plan to take it easy for the rest of today and you should NOT DRIVE or use heavy machinery until tomorrow (because of the sedation medicines used  during the test).    FOLLOW UP: Our staff will call the number listed on your records the next business day following your procedure to check on you and address any questions or concerns that you may have regarding the information given to you following your procedure. If we do not reach you, we will leave a message.  However, if you are feeling well and you are not experiencing any problems, there is no need to return our call.  We will assume that you have returned to your regular daily activities without incident.  If any biopsies were taken you will be contacted by phone or by letter within the next 1-3 weeks.  Please call us at [phone] if you have not heard about the biopsies in 3 weeks.    SIGNATURES/CONFIDENTIALITY: You and/or your care partner have signed paperwork which will be entered into your electronic medical record.  These signatures attest to the fact that that the information above on your After Visit Summary has been reviewed and is understood.  Full responsibility of the confidentiality of this discharge information lies with you and/or your care-partner.  Post dilation diet given with instructions.  Stenosis information given.  Reflux information given.  Avoid nonsteroidals  Continue Dexilant 60mg . Daily.

## 2018-01-23 NOTE — Op Note (Signed)
[place] Patient Name: Brenda Allen Procedure Date: 01/23/2018 9:35 AM MRN: [id number] Endoscopist: Jackquline Denmark , MD Age: 57 Referring MD:  Date of Birth: 09/03/1960 Gender: Female Account #: [id number] Procedure:                Upper GI endoscopy Indications:              Dysphagia Medicines:                Monitored Anesthesia Care Procedure:                Pre-Anesthesia Assessment:                           - Prior to the procedure, a History and Physical                            was performed, and patient medications and                            allergies were reviewed. The patient's tolerance of                            previous anesthesia was also reviewed. The risks                            and benefits of the procedure and the sedation                            options and risks were discussed with the patient.                            All questions were answered, and informed consent                            was obtained. Prior Anticoagulants: The patient has                            taken no previous anticoagulant or antiplatelet                            agents. ASA Grade Assessment: II - A patient with                            mild systemic disease. After reviewing the risks                            and benefits, the patient was deemed in                            satisfactory condition to undergo the procedure.                           After obtaining informed consent, the endoscope was  passed under direct vision. Throughout the                            procedure, the patient's blood pressure, pulse, and                            oxygen saturations were monitored continuously. The                            Model GIF-HQ190 [id number]) scope was introduced                            through the mouth, and advanced to the third part                            of duodenum. The upper GI endoscopy was                        accomplished without difficulty. The patient                            tolerated the procedure well. Scope In: Scope Out: Findings:                 One benign-appearing, intrinsic mild stenosis was                            found 36 cm from the incisors. This stenosis                            measured 1.4 cm (inner diameter). The stenosis was                            traversed. Healed distal esophageal erosions. Lower                            end of the esophagus was also examined by                            narrowband imaging. Biopsies were obtained from the                            proximal/mid to rule out eosinophilic esophagitis.                            Biopsies were also obtained from distal esophagus                            with cold forceps for histology rule out Barrett's                            esophagus. The scope was withdrawn. Dilation was                            performed with a  Maloney dilator with mild                            resistance at 52 Fr. Estimated blood loss: none.                           A few localized, 2 to 4 mm non-bleeding erosions                            were found in the gastric antrum with mild                            surrounding gastritis. There were no stigmata of                            recent bleeding. Biopsies were taken with a cold                            forceps for histology.                           8-10 small 4 to 6 mm sessile polyps with no                            bleeding and no stigmata of recent bleeding were                            found in the gastric fundus and in the gastric                            body. 3 polyps was removed with a cold biopsy                            forceps. Resection and retrieval were complete.                            Estimated blood loss was minimal.                           The examined duodenum was normal. Biopsies for                             histology were taken with a cold forceps for                            evaluation of celiac disease. Estimated blood loss:                            none. Complications:            No immediate complications. Estimated Blood Loss:     Estimated blood loss: none. Impression:               - Benign-appearing esophageal stenosis. Biopsied.  Dilated.                           - Non-bleeding erosive gastropathy. Biopsied.                           - Multiple gastric polyps. Resected and retrieved.                           - Normal examined duodenum. Biopsied. Recommendation:           - Patient has a contact number available for                            emergencies. The signs and symptoms of potential                            delayed complications were discussed with the                            patient. Return to normal activities tomorrow.                            Written discharge instructions were provided to the                            patient.                           - Post dilatation diet.                           - Continue present medications Dexilant 60 mg p.o.                            once a day.                           - Brochures regarding GERD.                           - Avoid nonsteroidals                           - Await pathology results.                           - Return to GI clinic PRN. Jackquline Denmark, MD 01/23/2018 10:02:13 AM This report has been signed electronically.

## 2018-01-26 ENCOUNTER — Telehealth: Payer: Self-pay

## 2018-01-26 ENCOUNTER — Telehealth: Payer: Self-pay | Admitting: *Deleted

## 2018-01-26 NOTE — Telephone Encounter (Signed)
  Follow up Call-  Call back number 01/23/2018  Post procedure Call Back phone  # #[phone] cell  Permission to leave phone message Yes  Some recent data might be hidden     Patient questions:  Do you have a fever, pain , or abdominal swelling? No. Pain Score  0 *  Have you tolerated food without any problems? Yes.    Have you been able to return to your normal activities? Yes.    Do you have any questions about your discharge instructions: Diet   No. Medications  No. Follow up visit  No.  Do you have questions or concerns about your Care? No.  Actions: * If pain score is 4 or above: No action needed, pain <4.

## 2018-01-26 NOTE — Telephone Encounter (Signed)
Left message on f/u call 

## 2018-01-28 ENCOUNTER — Encounter: Payer: Self-pay | Admitting: Gastroenterology

## 2018-02-09 DIAGNOSIS — E785 Hyperlipidemia, unspecified: Secondary | ICD-10-CM | POA: Diagnosis not present

## 2018-02-09 DIAGNOSIS — E559 Vitamin D deficiency, unspecified: Secondary | ICD-10-CM | POA: Diagnosis not present

## 2018-02-09 DIAGNOSIS — Z0000 Encounter for general adult medical examination without abnormal findings: Secondary | ICD-10-CM | POA: Diagnosis not present

## 2018-02-09 DIAGNOSIS — R7301 Impaired fasting glucose: Secondary | ICD-10-CM | POA: Diagnosis not present

## 2018-02-09 DIAGNOSIS — Z1331 Encounter for screening for depression: Secondary | ICD-10-CM | POA: Diagnosis not present

## 2018-02-16 DIAGNOSIS — J208 Acute bronchitis due to other specified organisms: Secondary | ICD-10-CM | POA: Diagnosis not present

## 2018-02-27 DIAGNOSIS — R399 Unspecified symptoms and signs involving the genitourinary system: Secondary | ICD-10-CM | POA: Diagnosis not present

## 2018-03-02 DIAGNOSIS — M1711 Unilateral primary osteoarthritis, right knee: Secondary | ICD-10-CM | POA: Diagnosis not present

## 2018-03-30 DIAGNOSIS — Z7952 Long term (current) use of systemic steroids: Secondary | ICD-10-CM | POA: Diagnosis not present

## 2018-03-30 DIAGNOSIS — M8589 Other specified disorders of bone density and structure, multiple sites: Secondary | ICD-10-CM | POA: Diagnosis not present

## 2018-04-01 HISTORY — PX: UPPER GASTROINTESTINAL ENDOSCOPY: SHX188

## 2018-05-27 IMAGING — MR MR FOOT*R* WO/W CM
5 of 7 series · 23 of 40 positions shown · IV contrast (multihance)
Comparison: Radiographs 06/28/2016

CLINICAL DATA: Forefoot pain.

EXAM:
MRI OF THE RIGHT FOREFOOT WITHOUT AND WITH CONTRAST
TECHNIQUE: Multiplanar, multisequence MR imaging of the right foot was
performed before and after the administration of intravenous
contrast.
CONTRAST:  19 cc MultiHance

[Series 8: T2 fat-sat · coronal · 4.0mm · 0.27mm/px · 7 of 30 slices shown (1 of 2)]
[im 1/30]
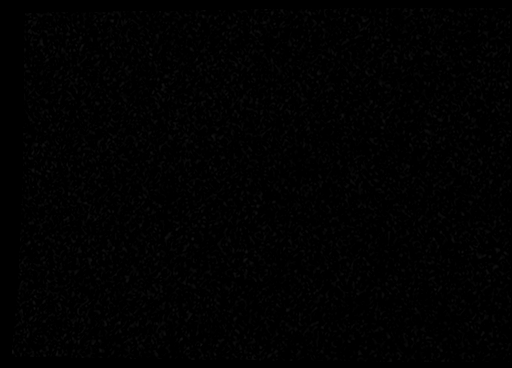
[im 5/30]
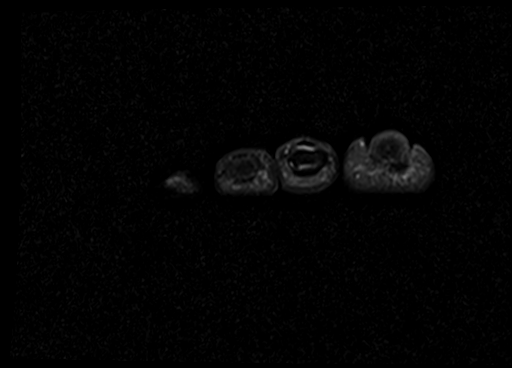
[im 10/30]
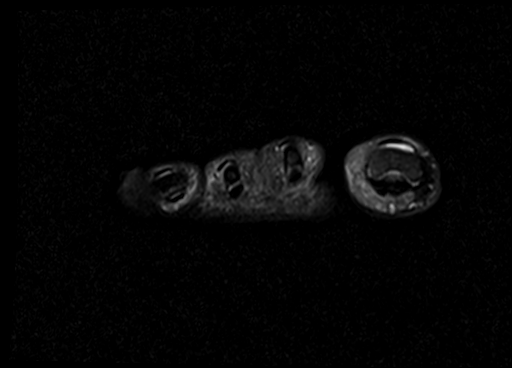
[im 15/30]
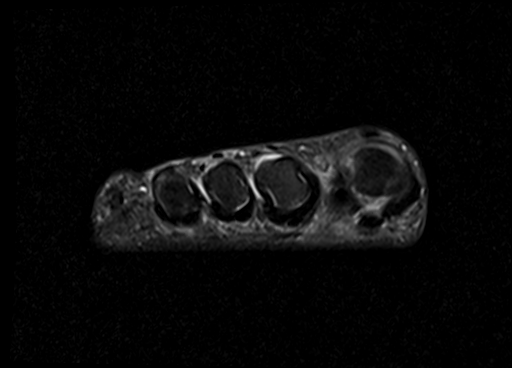
[im 20/30]
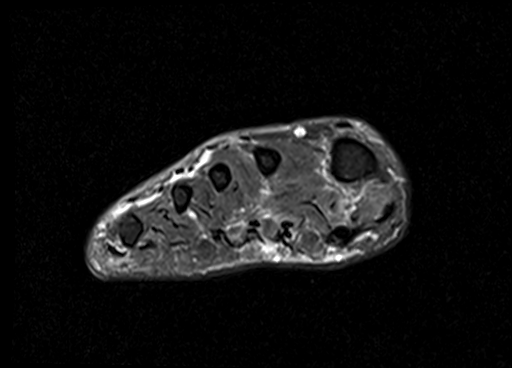
[im 25/30]
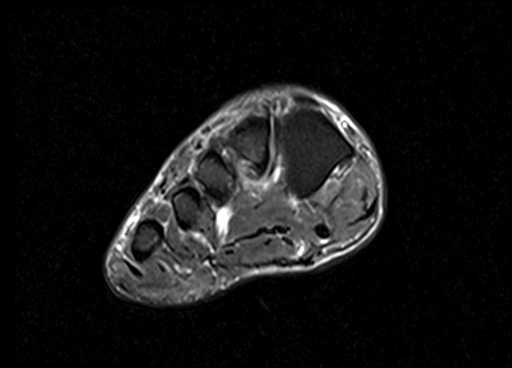
[im 30/30]
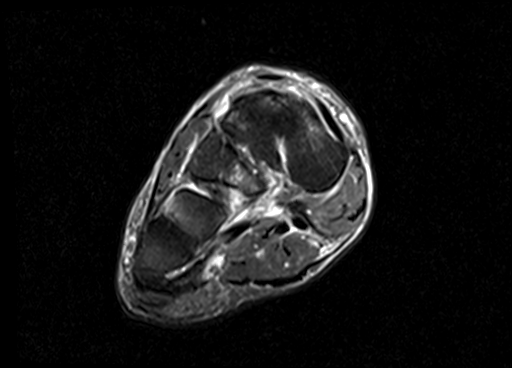

[Series 9: T1 · coronal · 4.0mm · 0.44mm/px · 7 of 30 slices shown (1 of 2)]
[im 1/30]
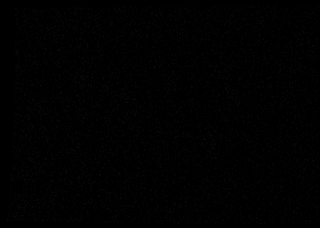
[im 5/30]
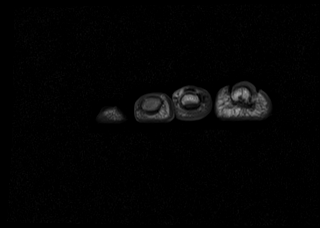
[im 10/30]
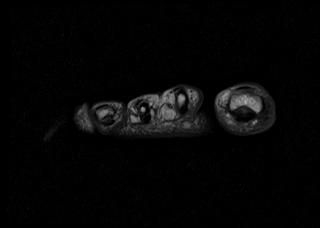
[im 15/30]
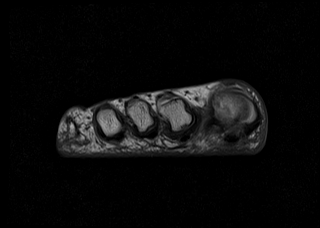
[im 20/30]
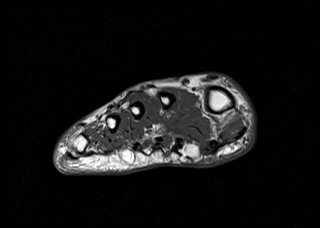
[im 25/30]
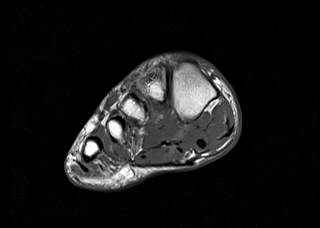
[im 30/30]
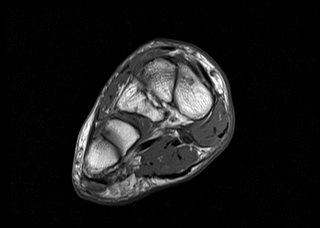

[Series 10: T1 · axial · 3.0mm · 0.31mm/px · z∈[-52,+6]mm · 4 of 20 slices shown (2 of 2)]
[im 1/20]
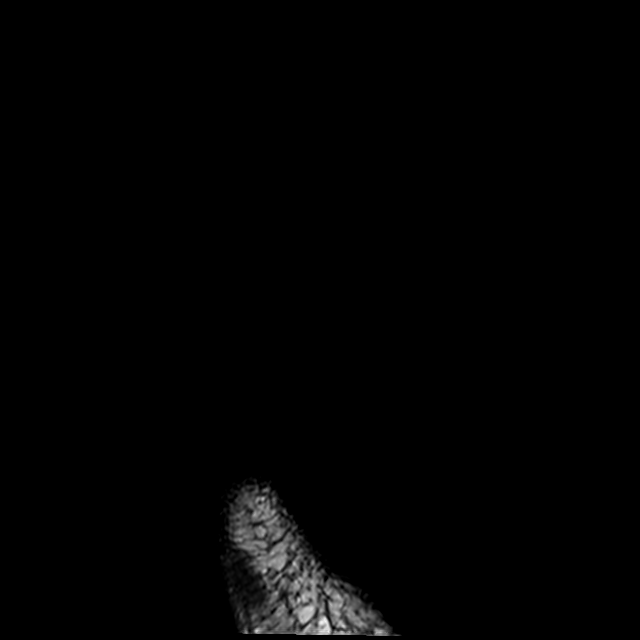
[im 7/20]
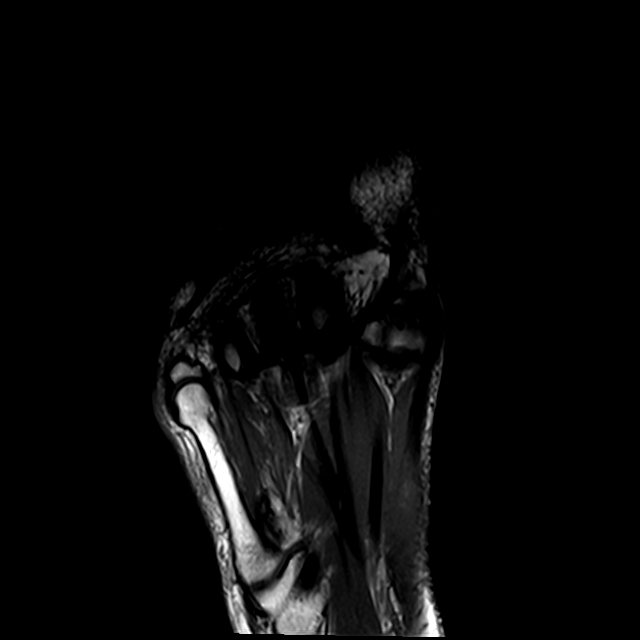
[im 13/20]
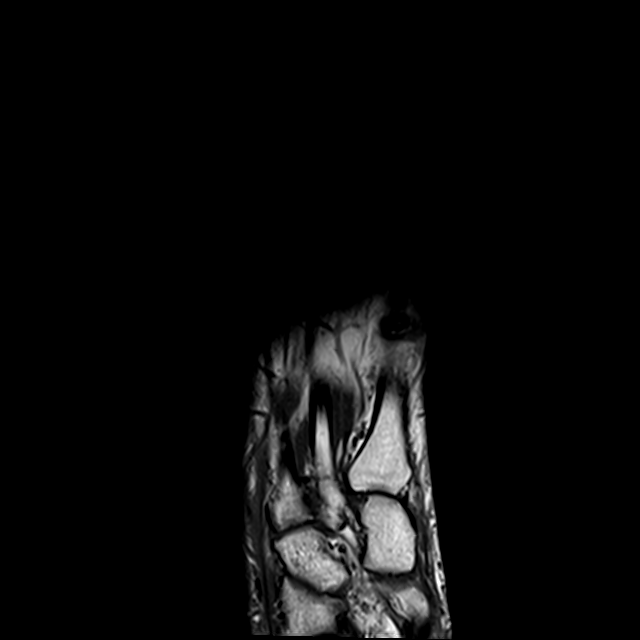
[im 20/20]
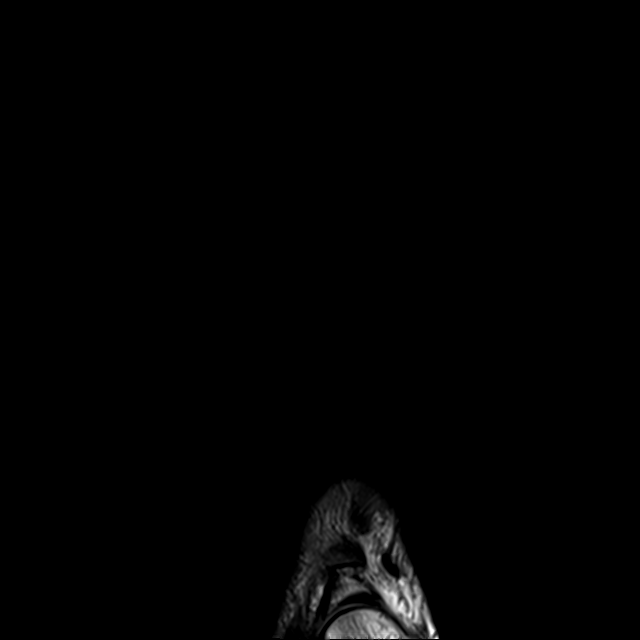

[Series 12: pre axial fs · coronal · non-contrast · 4.0mm · 0.25mm/px · 1 of 30 slices shown]
[im 1/30]
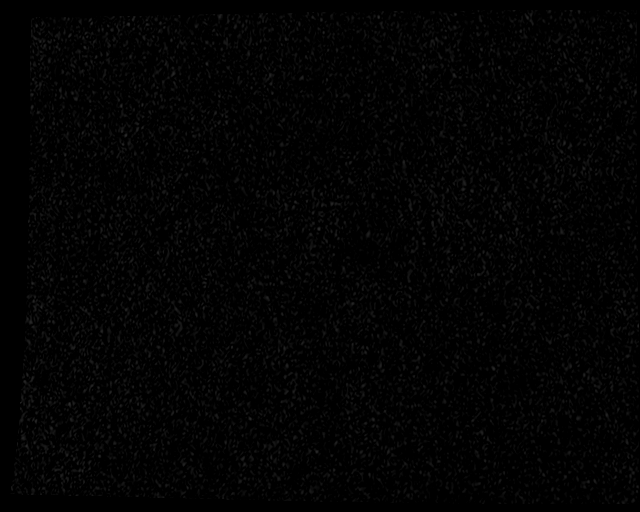

[Series 13: T2 fat-sat · axial · 3.0mm · 0.39mm/px · z∈[-52,+6]mm · 4 of 20 slices shown (2 of 2)]
[im 1/20]
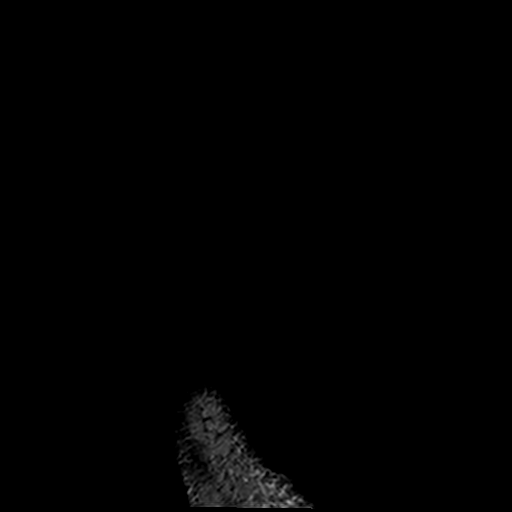
[im 7/20]
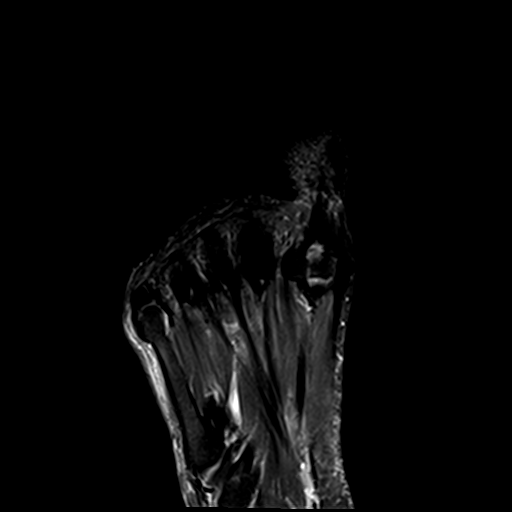
[im 13/20]
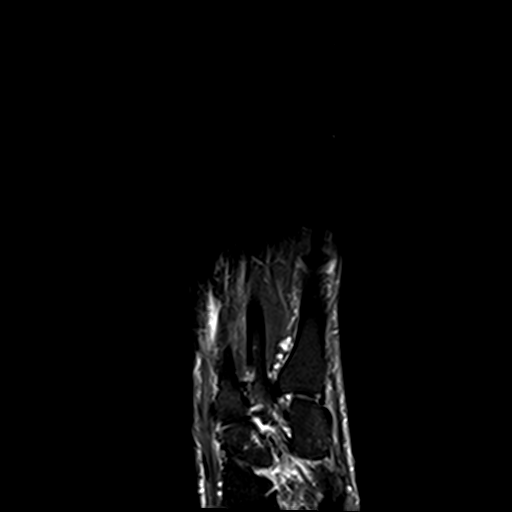
[im 20/20]
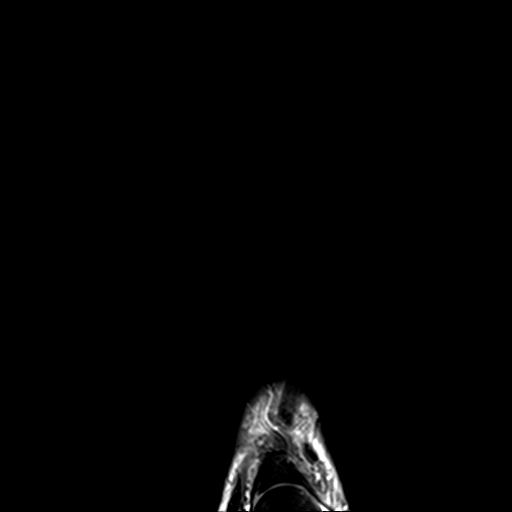

[23 of 40 positions shown; findings below may reference images not displayed]

FINDINGS: No acute bony findings. No stress fracture or AVN. Mild forefoot and
moderate midfoot degenerative changes but no obvious erosions. Small
metatarsal phalangeal and tarsal metatarsal joint effusions.

Mild intermetatarsal bursitis between the second and third and third
and fourth metatarsal heads. No findings suspicious for Morton's
neuroma.

The major tendons and ligaments are intact. The foot musculature
appears normal. The visualized plantar fascia appears normal.
IMPRESSION: 1. Mild forefoot and moderate midfoot degenerative changes.
2. No stress fracture or AVN.
3. Mild intermetatarsal bursitis between the second and third and
third and fourth metatarsal heads. No findings suspicious for
Morton's neuroma.

## 2018-07-14 ENCOUNTER — Other Ambulatory Visit: Payer: Self-pay | Admitting: Gastroenterology

## 2018-07-14 NOTE — Telephone Encounter (Signed)
Called pt. No answer. Need to ask if she has new insurance card and confirm which pharmacy she wants Dexilant sent to.  Spoke to [employer] and they will fax over new ins info and PA info as well.

## 2018-07-14 NOTE — Telephone Encounter (Signed)
Pt called and stated that [employer] requires a PA if pt is to take dexilant for over 90 days. [phone]

## 2018-07-15 MED ORDER — DEXLANSOPRAZOLE 60 MG PO CPDR: 60.0000 mg | DELAYED_RELEASE_CAPSULE | Freq: Every day | ORAL | 3 refills | Status: DC

## 2018-07-15 NOTE — Telephone Encounter (Signed)
Dexilant has been approved until 07/15/19. Pt informed and faxed approval to [facility].

## 2018-07-15 NOTE — Telephone Encounter (Signed)
Received fax from [place] with patients updated insurance information.   Prior authorization initiated in Covermymeds today for Dexilant 60mg  1 cap qd for a 30 day supply.  [facility]  ID [id number] Grp [id number]

## 2018-07-15 NOTE — Telephone Encounter (Signed)
Pt called back and stated that she had given [place] the wrong number.   Correct number for [employer]:  [phone] option 1.    Her pharmacy is [facility].

## 2018-10-05 ENCOUNTER — Telehealth (INDEPENDENT_AMBULATORY_CARE_PROVIDER_SITE_OTHER): Payer: 59 | Admitting: Gastroenterology

## 2018-10-05 ENCOUNTER — Encounter: Payer: Self-pay | Admitting: Gastroenterology

## 2018-10-05 ENCOUNTER — Other Ambulatory Visit: Payer: Self-pay

## 2018-10-05 VITALS — Ht 65.0 in | Wt 206.0 lb

## 2018-10-05 DIAGNOSIS — Z8719 Personal history of other diseases of the digestive system: Secondary | ICD-10-CM

## 2018-10-05 DIAGNOSIS — K2100 Gastro-esophageal reflux disease with esophagitis, without bleeding: Secondary | ICD-10-CM

## 2018-10-05 DIAGNOSIS — R1319 Other dysphagia: Secondary | ICD-10-CM

## 2018-10-05 DIAGNOSIS — R1310 Dysphagia, unspecified: Secondary | ICD-10-CM

## 2018-10-05 MED ORDER — DEXILANT 60 MG PO CPDR: 60.0000 mg | DELAYED_RELEASE_CAPSULE | Freq: Every day | ORAL | 11 refills | Status: DC

## 2018-10-05 NOTE — Patient Instructions (Addendum)
If you are age 59 or older, your body mass index should be between 23-30. Your Body mass index is 34.28 kg/m. If this is out of the aforementioned range listed, please consider follow up with your Primary Care Provider.  If you are age 51 or younger, your body mass index should be between 19-25. Your Body mass index is 34.28 kg/m. If this is out of the aformentioned range listed, please consider follow up with your Primary Care Provider.   You have been scheduled for a Barium Esophogram at [facility] (1st floor of the hospital) on 10/09/18  at 10:30am. Please arrive 15 minutes prior to your appointment for registration. Make certain not to have anything to eat or drink 3 hours prior to your test. If you need to reschedule for any reason, please contact radiology at [phone] to do so. __________________________________________________________________ A barium swallow is an examination that concentrates on views of the esophagus. This tends to be a double contrast exam (barium and two liquids which, when combined, create a gas to distend the wall of the oesophagus) or single contrast (non-ionic iodine based). The study is usually tailored to your symptoms so a good history is essential. Attention is paid during the study to the form, structure and configuration of the esophagus, looking for functional disorders (such as aspiration, dysphagia, achalasia, motility and reflux) EXAMINATION You may be asked to change into a gown, depending on the type of swallow being performed. A radiologist and radiographer will perform the procedure. The radiologist will advise you of the type of contrast selected for your procedure and direct you during the exam. You will be asked to stand, sit or lie in several different positions and to hold a small amount of fluid in your mouth before being asked to swallow while the imaging is performed .In some instances you may be asked to swallow barium coated  marshmallows to assess the motility of a solid food bolus. The exam can be recorded as a digital or video fluoroscopy procedure. POST PROCEDURE It will take 1-2 days for the barium to pass through your system. To facilitate this, it is important, unless otherwise directed, to increase your fluids for the next 24-48hrs and to resume your normal diet.  This test typically takes about 30 minutes to perform. __________________________________________________________________________________  Brenda Allen have been scheduled for an endoscopy. Please follow written instructions given to you at your visit today. If you use inhalers (even only as needed), please bring them with you on the day of your procedure. Your physician has requested that you go to www.startemmi.com and enter the access code given to you at your visit today. This web site gives a general overview about your procedure. However, you should still follow specific instructions given to you by our office regarding your preparation for the procedure.  Please continue taking Dexilant.   Hold Fosamax until your difficulty swallowing is resolved.   You will be due for a recall colonoscopy in 03/2020. We will send you a reminder in the mail when it gets closer to that time.  I have attached a Pre Procedure Patient Acknowledgement form and a prepaid envelope, please initial and sign form and mail back in envelope.    Thank you,  Dr. Jackquline Denmark
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
## 2018-10-08 ENCOUNTER — Other Ambulatory Visit: Payer: Self-pay

## 2018-10-08 DIAGNOSIS — R1319 Other dysphagia: Secondary | ICD-10-CM

## 2018-10-08 DIAGNOSIS — Z8719 Personal history of other diseases of the digestive system: Secondary | ICD-10-CM

## 2018-10-08 DIAGNOSIS — R1310 Dysphagia, unspecified: Secondary | ICD-10-CM

## 2018-10-08 DIAGNOSIS — K2100 Gastro-esophageal reflux disease with esophagitis, without bleeding: Secondary | ICD-10-CM

## 2018-10-09 ENCOUNTER — Other Ambulatory Visit: Payer: Self-pay

## 2018-10-09 ENCOUNTER — Ambulatory Visit (HOSPITAL_COMMUNITY)
Admission: RE | Admit: 2018-10-09 | Discharge: 2018-10-09 | Disposition: A | Discharge status: Home / Self Care | Payer: 59 | Source: Ambulatory Visit | Attending: Gastroenterology | Admitting: Gastroenterology

## 2018-10-09 DIAGNOSIS — R1310 Dysphagia, unspecified: Secondary | ICD-10-CM | POA: Diagnosis present

## 2018-10-09 DIAGNOSIS — K2100 Gastro-esophageal reflux disease with esophagitis, without bleeding: Secondary | ICD-10-CM

## 2018-10-09 DIAGNOSIS — Z8719 Personal history of other diseases of the digestive system: Secondary | ICD-10-CM | POA: Insufficient documentation

## 2018-10-09 DIAGNOSIS — R1319 Other dysphagia: Secondary | ICD-10-CM

## 2018-10-14 ENCOUNTER — Telehealth: Payer: Self-pay | Admitting: Gastroenterology

## 2018-10-14 DIAGNOSIS — N159 Renal tubulo-interstitial disease, unspecified: Secondary | ICD-10-CM

## 2018-10-14 HISTORY — DX: Renal tubulo-interstitial disease, unspecified: N15.9

## 2018-10-14 NOTE — Telephone Encounter (Signed)
Per Dr. Lyndel Safe it is fine to proceed with procedure. Patient was notified.

## 2018-10-14 NOTE — Telephone Encounter (Signed)

## 2018-10-14 NOTE — Telephone Encounter (Signed)
Pt returned call and answered “No” to all questions.  °  °Pt made aware of that care partner may come to the lobby during the procedure but will need to provide their own mask. ° ° °

## 2018-10-14 NOTE — Telephone Encounter (Signed)
Pt is scheduled for a procedure with Dr. Lyndel Safe tomorrow and was just dx with a kidney infection, pt was given an injection of rocephin and was given ciprosloxacin. Pt wants to know if she can still have procedure. Pls call her.

## 2018-10-15 ENCOUNTER — Other Ambulatory Visit: Payer: Self-pay

## 2018-10-15 ENCOUNTER — Encounter: Payer: Self-pay | Admitting: Gastroenterology

## 2018-10-15 ENCOUNTER — Ambulatory Visit (AMBULATORY_SURGERY_CENTER): Payer: 59 | Admitting: Gastroenterology

## 2018-10-15 VITALS — BP 96/63 | HR 63 | Temp 98.8°F | Resp 20 | Ht 65.0 in | Wt 206.0 lb

## 2018-10-15 DIAGNOSIS — K2100 Gastro-esophageal reflux disease with esophagitis, without bleeding: Secondary | ICD-10-CM

## 2018-10-15 DIAGNOSIS — R1310 Dysphagia, unspecified: Secondary | ICD-10-CM | POA: Diagnosis not present

## 2018-10-15 DIAGNOSIS — K222 Esophageal obstruction: Secondary | ICD-10-CM

## 2018-10-15 DIAGNOSIS — K317 Polyp of stomach and duodenum: Secondary | ICD-10-CM

## 2018-10-15 DIAGNOSIS — K3189 Other diseases of stomach and duodenum: Secondary | ICD-10-CM

## 2018-10-15 DIAGNOSIS — K449 Diaphragmatic hernia without obstruction or gangrene: Secondary | ICD-10-CM | POA: Diagnosis not present

## 2018-10-15 DIAGNOSIS — R1319 Other dysphagia: Secondary | ICD-10-CM

## 2018-10-15 DIAGNOSIS — K2970 Gastritis, unspecified, without bleeding: Secondary | ICD-10-CM

## 2018-10-15 MED ORDER — SODIUM CHLORIDE 0.9 % IV SOLN: 500.0000 mL | Freq: Once | INTRAVENOUS | Status: AC

## 2018-10-15 NOTE — Patient Instructions (Signed)
Impression/Recommendations:  Dilation diet handout given to patient. Hiatal hernia handout given to patient. Gastritis handout given to patient.  Continue present medications. Await pathology results. No aspirin, ibuprofen, naproxen, or other NSAID drugs for 5 days.  Tylenol only until October 20, 2018.  Follow-up in GI clinic in 6-8-weeks.    Chew foods especially meats and breads well and eat slowly.  YOU HAD AN ENDOSCOPIC PROCEDURE TODAY AT [facility]:   Refer to the procedure report that was given to you for any specific questions about what was found during the examination.  If the procedure report does not answer your questions, please call your gastroenterologist to clarify.  If you requested that your care partner not be given the details of your procedure findings, then the procedure report has been included in a sealed envelope for you to review at your convenience later.  YOU SHOULD EXPECT: Some feelings of bloating in the abdomen. Passage of more gas than usual.  Walking can help get rid of the air that was put into your GI tract during the procedure and reduce the bloating. If you had a lower endoscopy (such as a colonoscopy or flexible sigmoidoscopy) you may notice spotting of blood in your stool or on the toilet paper. If you underwent a bowel prep for your procedure, you may not have a normal bowel movement for a few days.  Please Note:  You might notice some irritation and congestion in your nose or some drainage.  This is from the oxygen used during your procedure.  There is no need for concern and it should clear up in a day or so.  SYMPTOMS TO REPORT IMMEDIATELY:  Following upper endoscopy (EGD)  Vomiting of blood or coffee ground material  New chest pain or pain under the shoulder blades  Painful or persistently difficult swallowing  New shortness of breath  Fever of 100F or higher  Black, tarry-looking stools  For urgent or emergent issues, a  gastroenterologist can be reached at any hour by calling [phone].   DIET:  We do recommend a small meal at first, but then you may proceed to your regular diet.  Drink plenty of fluids but you should avoid alcoholic beverages for 24 hours.  ACTIVITY:  You should plan to take it easy for the rest of today and you should NOT DRIVE or use heavy machinery until tomorrow (because of the sedation medicines used during the test).    FOLLOW UP: Our staff will call the number listed on your records 48-72 hours following your procedure to check on you and address any questions or concerns that you may have regarding the information given to you following your procedure. If we do not reach you, we will leave a message.  We will attempt to reach you two times.  During this call, we will ask if you have developed any symptoms of COVID 19. If you develop any symptoms (ie: fever, flu-like symptoms, shortness of breath, cough etc.) before then, please call [phone].  If you test positive for Covid 19 in the 2 weeks post procedure, please call and report this information to [place].    If any biopsies were taken you will be contacted by phone or by letter within the next 1-3 weeks.  Please call us at [phone] if you have not heard about the biopsies in 3 weeks.    SIGNATURES/CONFIDENTIALITY: You and/or your care partner have signed paperwork which will be entered into your electronic  medical record.  These signatures attest to the fact that that the information above on your After Visit Summary has been reviewed and is understood.  Full responsibility of the confidentiality of this discharge information lies with you and/or your care-partner.

## 2018-10-15 NOTE — Progress Notes (Signed)
Called to room to assist during endoscopic procedure.  Patient ID and intended procedure confirmed with present staff. Received instructions for my participation in the procedure from the performing physician.  

## 2018-10-15 NOTE — Op Note (Addendum)
[facility] Patient Name: Brenda Allen Procedure Date: 10/15/2018 9:02 AM MRN: [id number] Endoscopist: Jackquline Denmark , MD Age: 58 Referring MD:  Date of Birth: 10-21-1960 Gender: Female Account #: [id number] Procedure:                Upper GI endoscopy Indications:              Dysphagia with neg barium swallow Medicines:                Monitored Anesthesia Care Procedure:                Pre-Anesthesia Assessment:                           - Prior to the procedure, a History and Physical                            was performed, and patient medications and                            allergies were reviewed. The patient's tolerance of                            previous anesthesia was also reviewed. The risks                            and benefits of the procedure and the sedation                            options and risks were discussed with the patient.                            All questions were answered, and informed consent                            was obtained. Prior Anticoagulants: The patient has                            taken no previous anticoagulant or antiplatelet                            agents. ASA Grade Assessment: II - A patient with                            mild systemic disease. After reviewing the risks                            and benefits, the patient was deemed in                            satisfactory condition to undergo the procedure.                           After obtaining informed consent, the endoscope was  passed under direct vision. Throughout the                            procedure, the patient's blood pressure, pulse, and                            oxygen saturations were monitored continuously. The                            Endoscope was introduced through the mouth, and                            advanced to the second part of duodenum. The upper                            GI endoscopy was  accomplished without difficulty.                            The patient tolerated the procedure well. Scope In: Scope Out: Findings:                 One benign-appearing, wide open mild stenosis was                            found 40 cm from the incisors, at the                            gastroesophageal junction. The scope was withdrawn.                            Dilation was performed with a Maloney dilator with                            mild resistance at 52 Fr and 54Fr. Biopsies were                            obtained from the proximal and distal esophagus                            with cold forceps for histology of suspected                            eosinophilic esophagitis.                           A 2 cm hiatal hernia was present (extending from 40                            up to 42 cm from the incisors)                           Localized mild inflammation characterized by  erythema was found in the gastric antrum. Biopsies                            were taken with a cold forceps for histology.                            Estimated blood loss: none.                           A few (5-6), 2 to 6 mm sessile polyps with no                            bleeding and no stigmata of recent bleeding were                            found in the gastric fundus and in the gastric                            body. 3 polyps were removed with a cold biopsy                            forceps. Resection and retrieval were complete.                            Estimated blood loss: none.                           The in the duodenum was normal. Complications:            No immediate complications. Estimated Blood Loss:     Estimated blood loss: none. Impression:               - Benign-appearing esophageal stenosis. Dilated.                            Biopsied.                           - 2 cm hiatal hernia.                           - Mild gastritis.                            - A few gastric polyps. Resected and retrieved. Recommendation:           - Patient has a contact number available for                            emergencies. The signs and symptoms of potential                            delayed complications were discussed with the                            patient. Return to normal  activities tomorrow.                            Written discharge instructions were provided to the                            patient.                           - Postdilatation diet.                           - Continue present medications.                           - Await pathology results.                           - No aspirin, ibuprofen, naproxen, or other                            non-steroidal anti-inflammatory drugs for 5 days.                           - Follow-up in the GI clinic in 6 to 8 weeks. If                            still with problems, will perform further work-up                            by means of manometry.                           - Chew foods especially meats and breads well and                            eat slowly. Jackquline Denmark, MD 10/15/2018 9:20:32 AM This report has been signed electronically.

## 2018-10-15 NOTE — Progress Notes (Signed)
To PACU, VSS. Report to [email or web address] 

## 2018-10-19 ENCOUNTER — Telehealth: Payer: Self-pay

## 2018-10-19 NOTE — Telephone Encounter (Signed)
  Follow up Call-  Call back number 10/15/2018 01/23/2018  Post procedure Call Back phone  # [phone] husband,   home [phone] self [phone] cell  Permission to leave phone message Yes Yes  Some recent data might be hidden     Patient questions:  Do you have a fever, pain , or abdominal swelling? No. Pain Score  0 *  Have you tolerated food without any problems? Yes.    Have you been able to return to your normal activities? Yes.    Do you have any questions about your discharge instructions: Diet   No. Medications  No. Follow up visit  No.  Do you have questions or concerns about your Care? No.  Actions: * If pain score is 4 or above: No action needed, pain <4.  1. Have you developed a fever since your procedure? no  2.   Have you had an respiratory symptoms (SOB or cough) since your procedure? no  3.   Have you tested positive for COVID 19 since your procedure no  4.   Have you had any family members/close contacts diagnosed with the COVID 19 since your procedure?  no   If yes to any of these questions please route to Joylene John, RN and Alphonsa Gin, Therapist, sports.

## 2018-10-20 ENCOUNTER — Encounter: Payer: Self-pay | Admitting: Gastroenterology

## 2019-07-23 ENCOUNTER — Telehealth: Payer: Self-pay | Admitting: Gastroenterology

## 2019-07-23 ENCOUNTER — Other Ambulatory Visit: Payer: Self-pay | Admitting: Gastroenterology

## 2019-07-23 MED ORDER — DEXILANT 60 MG PO CPDR: 60.0000 mg | DELAYED_RELEASE_CAPSULE | Freq: Every day | ORAL | 3 refills | Status: DC

## 2019-07-23 NOTE — Telephone Encounter (Signed)
Medication is at [facility] in [place] patient can pick up when she is ready. She has been notified

## 2019-07-23 NOTE — Telephone Encounter (Signed)
Patient calling states the pharmacy does not have her script

## 2019-07-23 NOTE — Telephone Encounter (Signed)
Dexilant 60 mg #90 with 3 refills sent to [facility] [place]

## 2019-07-26 ENCOUNTER — Telehealth: Payer: Self-pay | Admitting: Gastroenterology

## 2019-07-26 NOTE — Telephone Encounter (Signed)
Pt called to inform that prescription for Dexilant needs PA. Her pharmacy told her that they faxed PA form today to our office.

## 2019-07-28 NOTE — Telephone Encounter (Signed)
Spoke to patient's pharmacy and they said that patient has exceed her limit of dexilant and that they will not pay for it. I called patient and she proceeded to say that she has been on the phone with the insurance and needed a prior auth for the medication and I proceeded to tell her that what the pharmacy stated to me. She then said that insurance was suppose to send a form to have Dr. Lyndel Safe to sign and I gave her the correct fax number for the office. Patient was complaining that this was going on for 2 weeks that she has been calling and speaking to me and I corrected her and told her that this is the first time that we have actual spoke. But patient said she will call her insurance to have them fax over the form

## 2019-07-28 NOTE — Telephone Encounter (Signed)
PA done with insurance and pharmacist quoted 90 capsule for 20 dollars. PA [id number] approved by sheri at [phone]

## 2020-06-12 ENCOUNTER — Encounter: Payer: Self-pay | Admitting: Gastroenterology

## 2020-07-01 ENCOUNTER — Other Ambulatory Visit: Payer: Self-pay | Admitting: Gastroenterology

## 2020-07-10 ENCOUNTER — Encounter: Payer: Self-pay | Admitting: Gastroenterology

## 2020-07-10 NOTE — Telephone Encounter (Signed)
Patient will call pharmacy and her insurance on this. We have confirmation from [employer] that her medication has been approved for a year from 07-10-2020 to 07-10-2021

## 2020-07-10 NOTE — Telephone Encounter (Signed)
Inbound call from patient regarding prescription dexilant. Stated it is now over $400. Wants a call about coupon or why it jumped from $20. Best contact # [phone]

## 2020-08-09 ENCOUNTER — Ambulatory Visit (AMBULATORY_SURGERY_CENTER): Payer: Self-pay

## 2020-08-09 ENCOUNTER — Other Ambulatory Visit: Payer: Self-pay

## 2020-08-09 VITALS — Ht 66.0 in | Wt 214.0 lb

## 2020-08-09 DIAGNOSIS — Z86010 Personal history of colonic polyps: Secondary | ICD-10-CM

## 2020-08-09 MED ORDER — SUTAB 1479-225-188 MG PO TABS: 1.0000 | ORAL_TABLET | ORAL | 0 refills | Status: DC

## 2020-08-09 NOTE — Progress Notes (Signed)
No egg or soy allergy known to patient  No issues with past sedation with any surgeries or procedures Patient denies ever being told they had issues or difficulty with intubation ----patient reports she is a "light weight" with sedation, she does "not needs as much sedation as a normal person does" No FH of Malignant Hyperthermia No diet pills per patient No home 02 use per patient  No blood thinners per patient  Pt denies issues with constipation  No A fib or A flutter  EMMI video via MyChart  COVID 19 guidelines implemented in [place] today with Pt and RN  Coupon given to pt in [facility] today, Code to Pharmacy and NO PA's for preps discussed with pt in [facility] today  Discussed with pt there will be an out-of-pocket cost for prep and that varies from $0 to 70 dollars  Due to the COVID-19 pandemic we are asking patients to follow certain guidelines.  Pt aware of COVID protocols and [facility] guidelines

## 2020-08-18 ENCOUNTER — Encounter: Payer: Self-pay | Admitting: Gastroenterology

## 2020-08-23 ENCOUNTER — Encounter: Payer: Self-pay | Admitting: Gastroenterology

## 2020-08-23 ENCOUNTER — Other Ambulatory Visit: Payer: Self-pay

## 2020-08-23 ENCOUNTER — Ambulatory Visit (AMBULATORY_SURGERY_CENTER): Payer: No Typology Code available for payment source | Admitting: Gastroenterology

## 2020-08-23 VITALS — BP 131/86 | HR 65 | Temp 96.8°F | Resp 15 | Ht 66.0 in | Wt 214.0 lb

## 2020-08-23 DIAGNOSIS — K635 Polyp of colon: Secondary | ICD-10-CM

## 2020-08-23 DIAGNOSIS — D122 Benign neoplasm of ascending colon: Secondary | ICD-10-CM

## 2020-08-23 DIAGNOSIS — Z86010 Personal history of colonic polyps: Secondary | ICD-10-CM

## 2020-08-23 MED ORDER — DICYCLOMINE HCL 10 MG PO CAPS: 10.0000 mg | ORAL_CAPSULE | Freq: Two times a day (BID) | ORAL | 2 refills | Status: DC

## 2020-08-23 MED ORDER — SODIUM CHLORIDE 0.9 % IV SOLN
500.0000 mL | Freq: Once | INTRAVENOUS | Status: DC
Start: 2020-08-23 — End: 2020-08-23

## 2020-08-23 NOTE — Patient Instructions (Signed)
YOU HAD AN ENDOSCOPIC PROCEDURE TODAY AT THE [facility]:   Refer to the procedure report that was given to you for any specific questions about what was found during the examination.  If the procedure report does not answer your questions, please call your gastroenterologist to clarify.  If you requested that your care partner not be given the details of your procedure findings, then the procedure report has been included in a sealed envelope for you to review at your convenience later.  YOU SHOULD EXPECT: Some feelings of bloating in the abdomen. Passage of more gas than usual.  Walking can help get rid of the air that was put into your GI tract during the procedure and reduce the bloating. If you had a lower endoscopy (such as a colonoscopy or flexible sigmoidoscopy) you may notice spotting of blood in your stool or on the toilet paper. If you underwent a bowel prep for your procedure, you may not have a normal bowel movement for a few days.  Please Note:  You might notice some irritation and congestion in your nose or some drainage.  This is from the oxygen used during your procedure.  There is no need for concern and it should clear up in a day or so.  SYMPTOMS TO REPORT IMMEDIATELY:   Following lower endoscopy (colonoscopy or flexible sigmoidoscopy):  Excessive amounts of blood in the stool  Significant tenderness or worsening of abdominal pains  Swelling of the abdomen that is new, acute  Fever of 100F or higher   Following upper endoscopy (EGD)  Vomiting of blood or coffee ground material  New chest pain or pain under the shoulder blades  Painful or persistently difficult swallowing  New shortness of breath  Fever of 100F or higher  Black, tarry-looking stools  For urgent or emergent issues, a gastroenterologist can be reached at any hour by calling [phone]. Do not use MyChart messaging for urgent concerns.    DIET:  We do recommend a small meal at first, but  then you may proceed to your regular diet.  Drink plenty of fluids but you should avoid alcoholic beverages for 24 hours.  ACTIVITY:  You should plan to take it easy for the rest of today and you should NOT DRIVE or use heavy machinery until tomorrow (because of the sedation medicines used during the test).    FOLLOW UP: Our staff will call the number listed on your records 48-72 hours following your procedure to check on you and address any questions or concerns that you may have regarding the information given to you following your procedure. If we do not reach you, we will leave a message.  We will attempt to reach you two times.  During this call, we will ask if you have developed any symptoms of COVID 19. If you develop any symptoms (ie: fever, flu-like symptoms, shortness of breath, cough etc.) before then, please call [phone].  If you test positive for Covid 19 in the 2 weeks post procedure, please call and report this information to us.    If any biopsies were taken you will be contacted by phone or by letter within the next 1-3 weeks.  Please call us at [phone] if you have not heard about the biopsies in 3 weeks.    SIGNATURES/CONFIDENTIALITY: You and/or your care partner have signed paperwork which will be entered into your electronic medical record.  These signatures attest to the fact that that the information above on   your After Visit Summary has been reviewed and is understood.  Full responsibility of the confidentiality of this discharge information lies with you and/or your care-partner. 

## 2020-08-23 NOTE — Progress Notes (Signed)
Pt's states no medical or surgical changes since previsit or office visit. 

## 2020-08-23 NOTE — Op Note (Signed)
[facility] Patient Name: Brenda Allen Procedure Date: 08/23/2020 1:54 PM MRN: [id number] Endoscopist: Jackquline Denmark , MD Age: 60 Referring MD:  Date of Birth: 02-13-1961 Gender: Female Account #: [id number] Procedure:                Colonoscopy Indications:              High risk colon cancer surveillance: Personal                            history of colonic polyps Medicines:                Monitored Anesthesia Care Procedure:                Pre-Anesthesia Assessment:                           - Prior to the procedure, a History and Physical                            was performed, and patient medications and                            allergies were reviewed. The patient's tolerance of                            previous anesthesia was also reviewed. The risks                            and benefits of the procedure and the sedation                            options and risks were discussed with the patient.                            All questions were answered, and informed consent                            was obtained. Prior Anticoagulants: The patient has                            taken no previous anticoagulant or antiplatelet                            agents. ASA Grade Assessment: II - A patient with                            mild systemic disease. After reviewing the risks                            and benefits, the patient was deemed in                            satisfactory condition to undergo the procedure.  After obtaining informed consent, the colonoscope                            was passed under direct vision. Throughout the                            procedure, the patient's blood pressure, pulse, and                            oxygen saturations were monitored continuously. The                            Olympus PCF-H190DL (#[id number]) Colonoscope was                            introduced through the anus and advanced to  the 2                            cm into the ileum. The colonoscopy was performed                            without difficulty. The patient tolerated the                            procedure well. The terminal ileum, ileocecal                            valve, appendiceal orifice, and rectum were                            photographed. Scope In: 2:06:42 PM Scope Out: 2:16:11 PM Scope Withdrawal Time: 0 hours 6 minutes 8 seconds  Total Procedure Duration: 0 hours 9 minutes 29 seconds  Findings:                 A 8 mm polyp was found in the proximal ascending                            colon. The polyp was sessile. The polyp was removed                            with a cold snare. Resection and retrieval were                            complete.                           Multiple medium-mouthed diverticula were found in                            the sigmoid colon, few in hepatic flexure and                            ascending colon.  Non-bleeding internal hemorrhoids were found during                            retroflexion. The hemorrhoids were small.                           The terminal ileum appeared normal.                           The exam was otherwise without abnormality on                            direct and retroflexion views. Complications:            No immediate complications. Estimated Blood Loss:     Estimated blood loss: none. Impression:               - One 8 mm polyp in the proximal ascending colon,                            removed with a cold snare. Resected and retrieved.                           - Moderate predominantly sigmoid diverticulosis.                           - Non-bleeding internal hemorrhoids.                           - The examined portion of the ileum was normal.                           - The examination was otherwise normal on direct                            and retroflexion views. Recommendation:           -  Patient has a contact number available for                            emergencies. The signs and symptoms of potential                            delayed complications were discussed with the                            patient. Return to normal activities tomorrow.                            Written discharge instructions were provided to the                            patient.                           - High fiber diet.                           -  Continue present medications.                           - Await pathology results.                           - Repeat colonoscopy for surveillance based on                            pathology results.                           - The findings and recommendations were discussed                            with the patient's family. Jackquline Denmark, MD 08/23/2020 2:23:57 PM This report has been signed electronically.

## 2020-08-23 NOTE — Progress Notes (Signed)
Called to room to assist during endoscopic procedure.  Patient ID and intended procedure confirmed with present staff. Received instructions for my participation in the procedure from the performing physician.  

## 2020-08-23 NOTE — Progress Notes (Signed)
pt tolerated well. VSS. awake and to recovery. Report given to RN.  

## 2020-08-25 ENCOUNTER — Telehealth: Payer: Self-pay

## 2020-08-25 NOTE — Telephone Encounter (Signed)
  Follow up Call-  Call back number 08/23/2020 10/15/2018 01/23/2018  Post procedure Call Back phone  # [phone] husband,   home [phone] self [phone] cell  Permission to leave phone message Yes Yes Yes  Some recent data might be hidden     Patient questions:  Do you have a fever, pain , or abdominal swelling? No. Pain Score  0 *  Have you tolerated food without any problems? Yes.    Have you been able to return to your normal activities? Yes.    Do you have any questions about your discharge instructions: Diet   No. Medications  No. Follow up visit  No.  Do you have questions or concerns about your Care? No.  Actions: * If pain score is 4 or above: No action needed, pain <4.  1. Have you developed a fever since your procedure? no  2.   Have you had an respiratory symptoms (SOB or cough) since your procedure? no  3.   Have you tested positive for COVID 19 since your procedure no  4.   Have you had any family members/close contacts diagnosed with the COVID 19 since your procedure?  no   If yes to any of these questions please route to Joylene John, RN and Joella Prince, RN

## 2020-08-31 ENCOUNTER — Encounter: Payer: Self-pay | Admitting: Gastroenterology

## 2020-10-31 NOTE — Patient Instructions (Signed)
DUE TO COVID-19 ONLY ONE VISITOR IS ALLOWED TO COME WITH YOU AND STAY IN THE WAITING ROOM ONLY DURING PRE OP AND PROCEDURE DAY OF SURGERY. THE 1 VISITOR  MAY VISIT WITH YOU AFTER SURGERY IN YOUR PRIVATE ROOM DURING VISITING HOURS ONLY!               Brenda Allen   Your procedure is scheduled on: 11/14/20   Report to [facility] Main  Entrance   Report to admitting at : 11:50 AM     Call this number if you have problems the morning of surgery [phone]    Remember: NO SOLID FOOD AFTER MIDNIGHT THE NIGHT PRIOR TO SURGERY. NOTHING BY MOUTH EXCEPT CLEAR LIQUIDS UNTIL: 11:20 AM . PLEASE FINISH ENSURE DRINK PER SURGEON ORDER  WHICH NEEDS TO BE COMPLETED AT : 11:20 AM.   CLEAR LIQUID DIET  Foods Allowed                                                                     Foods Excluded  Coffee and tea, regular and decaf                             liquids that you cannot  Plain Jell-O any favor except red or purple                                           see through such as: Fruit ices (not with fruit pulp)                                     milk, soups, orange juice  Iced Popsicles                                    All solid food Carbonated beverages, regular and diet                                    Cranberry, grape and apple juices Sports drinks like Gatorade Lightly seasoned clear broth or consume(fat free) Sugar, honey syrup  Sample Menu Breakfast                                Lunch                                     Supper Cranberry juice                    Beef broth                            Chicken broth Jell-O  Grape juice                           Apple juice Coffee or tea                        Jell-O                                      Popsicle                                                Coffee or tea                        Coffee or tea  _____________________________________________________________________   BRUSH  YOUR TEETH MORNING OF SURGERY AND RINSE YOUR MOUTH OUT, NO CHEWING GUM CANDY OR MINTS.    Take these medicines the morning of surgery with A SIP OF WATER: bupropion,loratadine,dexilant,bentyl.                               You may not have any metal on your body including hair pins and              piercings  Do not wear jewelry, make-up, lotions, powders or perfumes, deodorant             Do not wear nail polish on your fingernails.  Do not shave  48 hours prior to surgery.    Do not bring valuables to the hospital. Brenda Allen.  Contacts, dentures or bridgework may not be worn into surgery.  Leave suitcase in the car. After surgery it may be brought to your room.     Patients discharged the day of surgery will not be allowed to drive home. IF YOU ARE HAVING SURGERY AND GOING HOME THE SAME DAY, YOU MUST HAVE AN ADULT TO DRIVE YOU HOME AND BE WITH YOU FOR 24 HOURS. YOU MAY GO HOME BY TAXI OR UBER OR ORTHERWISE, BUT AN ADULT MUST ACCOMPANY YOU HOME AND STAY WITH YOU FOR 24 HOURS.  Name and phone number of your driver:  Special Instructions: N/A              Please read over the following fact sheets you were given: ____________________________________________________________________           [facility] - Preparing for Surgery Before surgery, you can play an important role.  Because skin is not sterile, your skin needs to be as free of germs as possible.  You can reduce the number of germs on your skin by washing with CHG (chlorahexidine gluconate) soap before surgery.  CHG is an antiseptic cleaner which kills germs and bonds with the skin to continue killing germs even after washing. Please DO NOT use if you have an allergy to CHG or antibacterial soaps.  If your skin becomes reddened/irritated stop using the CHG and inform your nurse when you arrive at Short Stay. Do not shave (including legs and underarms) for at least 48 hours prior to the  first CHG shower.  You may shave your face/neck. Please follow these instructions carefully:  1.  Shower with CHG Soap the night before surgery and the  morning of Surgery.  2.  If you choose to wash your hair, wash your hair first as usual with your  normal  shampoo.  3.  After you shampoo, rinse your hair and body thoroughly to remove the  shampoo.                           4.  Use CHG as you would any other liquid soap.  You can apply chg directly  to the skin and wash                       Gently with a scrungie or clean washcloth.  5.  Apply the CHG Soap to your body ONLY FROM THE NECK DOWN.   Do not use on face/ open                           Wound or open sores. Avoid contact with eyes, ears mouth and genitals (private parts).                       Wash face,  Genitals (private parts) with your normal soap.             6.  Wash thoroughly, paying special attention to the area where your surgery  will be performed.  7.  Thoroughly rinse your body with warm water from the neck down.  8.  DO NOT shower/wash with your normal soap after using and rinsing off  the CHG Soap.                9.  Pat yourself dry with a clean towel.            10.  Wear clean pajamas.            11.  Place clean sheets on your bed the night of your first shower and do not  sleep with pets. Day of Surgery : Do not apply any lotions/deodorants the morning of surgery.  Please wear clean clothes to the hospital/surgery center.  FAILURE TO FOLLOW THESE INSTRUCTIONS MAY RESULT IN THE CANCELLATION OF YOUR SURGERY PATIENT SIGNATURE_________________________________  NURSE SIGNATURE__________________________________  ________________________________________________________________________   Brenda Allen  An incentive spirometer is a tool that can help keep your lungs clear and active. This tool measures how well you are filling your lungs with each breath. Taking long deep breaths may help reverse or decrease  the chance of developing breathing (pulmonary) problems (especially infection) following: A long period of time when you are unable to move or be active. BEFORE THE PROCEDURE  If the spirometer includes an indicator to show your best effort, your nurse or respiratory therapist will set it to a desired goal. If possible, sit up straight or lean slightly forward. Try not to slouch. Hold the incentive spirometer in an upright position. INSTRUCTIONS FOR USE  Sit on the edge of your bed if possible, or sit up as far as you can in bed or on a chair. Hold the incentive spirometer in an upright position. Breathe out normally. Place the mouthpiece in your mouth and seal your lips tightly around it. Breathe in slowly and as deeply as possible, raising the piston or the ball  toward the top of the column. Hold your breath for 3-5 seconds or for as long as possible. Allow the piston or ball to fall to the bottom of the column. Remove the mouthpiece from your mouth and breathe out normally. Rest for a few seconds and repeat Steps 1 through 7 at least 10 times every 1-2 hours when you are awake. Take your time and take a few normal breaths between deep breaths. The spirometer may include an indicator to show your best effort. Use the indicator as a goal to work toward during each repetition. After each set of 10 deep breaths, practice coughing to be sure your lungs are clear. If you have an incision (the cut made at the time of surgery), support your incision when coughing by placing a pillow or rolled up towels firmly against it. Once you are able to get out of bed, walk around indoors and cough well. You may stop using the incentive spirometer when instructed by your caregiver.  RISKS AND COMPLICATIONS Take your time so you do not get dizzy or light-headed. If you are in pain, you may need to take or ask for pain medication before doing incentive spirometry. It is harder to take a deep breath if you are  having pain. AFTER USE Rest and breathe slowly and easily. It can be helpful to keep track of a log of your progress. Your caregiver can provide you with a simple table to help with this. If you are using the spirometer at home, follow these instructions: [place] IF:  You are having difficultly using the spirometer. You have trouble using the spirometer as often as instructed. Your pain medication is not giving enough relief while using the spirometer. You develop fever of 100.5 F (38.1 C) or higher. SEEK IMMEDIATE MEDICAL CARE IF:  You cough up bloody sputum that had not been present before. You develop fever of 102 F (38.9 C) or greater. You develop worsening pain at or near the incision site. MAKE SURE YOU:  Understand these instructions. Will watch your condition. Will get help right away if you are not doing well or get worse. Document Released: 07/29/2006 Document Revised: 06/10/2011 Document Reviewed: 09/29/2006 [facility] Patient Information 2014 [place], [place].   ________________________________________________________________________

## 2020-11-01 ENCOUNTER — Encounter (HOSPITAL_COMMUNITY)
Admission: RE | Admit: 2020-11-01 | Discharge: 2020-11-01 | Disposition: A | Discharge status: Home / Self Care | Payer: No Typology Code available for payment source | Source: Ambulatory Visit | Attending: Orthopedic Surgery | Admitting: Orthopedic Surgery

## 2020-11-01 ENCOUNTER — Encounter (HOSPITAL_COMMUNITY): Payer: Self-pay

## 2020-11-01 ENCOUNTER — Other Ambulatory Visit: Payer: Self-pay

## 2020-11-01 DIAGNOSIS — Z01812 Encounter for preprocedural laboratory examination: Secondary | ICD-10-CM | POA: Diagnosis present

## 2020-11-01 HISTORY — DX: Prediabetes: R73.03

## 2020-11-01 LAB — COMPREHENSIVE METABOLIC PANEL
ALT: 21 U/L (ref 0–44)
AST: 20 U/L (ref 15–41)
Albumin: 4.7 g/dL (ref 3.5–5.0)
Alkaline Phosphatase: 68 U/L (ref 38–126)
Anion gap: 10 (ref 5–15)
BUN: 17 mg/dL (ref 6–20)
CO2: 25 mmol/L (ref 22–32)
Calcium: 9.1 mg/dL (ref 8.9–10.3)
Chloride: 106 mmol/L (ref 98–111)
Creatinine, Ser: 0.88 mg/dL (ref 0.44–1.00)
GFR, Estimated: >60 mL/min (ref >60)
Glucose, Bld: 103 mg/dL — ABNORMAL HIGH (ref 70–99)
Potassium: 4.2 mmol/L (ref 3.5–5.1)
Sodium: 141 mmol/L (ref 135–145)
Total Bilirubin: 0.7 mg/dL (ref 0.3–1.2)
Total Protein: 7.3 g/dL (ref 6.5–8.1)

## 2020-11-01 LAB — CBC
HCT: 40.7 % (ref 36.0–46.0)
Hemoglobin: 13.1 g/dL (ref 12.0–15.0)
MCH: 30 pg (ref 26.0–34.0)
MCHC: 32.2 g/dL (ref 30.0–36.0)
MCV: 93.1 fL (ref 80.0–100.0)
Platelets: 189 10*3/uL (ref 150–400)
RBC: 4.37 MIL/uL (ref 3.87–5.11)
RDW: 13.8 % (ref 11.5–15.5)
WBC: 5 10*3/uL (ref 4.0–10.5)
nRBC: 0 % (ref 0.0–0.2)

## 2020-11-01 LAB — TYPE AND SCREEN
ABO/RH(D): AB POS
Antibody Screen: NEGATIVE

## 2020-11-01 LAB — SURGICAL PCR SCREEN
MRSA, PCR: NEGATIVE
Staphylococcus aureus: NEGATIVE

## 2020-11-01 LAB — HEMOGLOBIN A1C
Hgb A1c MFr Bld: 5.8 % — ABNORMAL HIGH (ref 4.8–5.6)
Mean Plasma Glucose: 119.76 mg/dL

## 2020-11-01 NOTE — Progress Notes (Signed)
COVID Vaccine Completed: Yes Date COVID Vaccine completed: 12/2020. X 3 COVID vaccine manufacturer: Pfizer      PCP - Dr. Nelda Bucks Cardiologist -   Chest x-ray -  EKG -  Stress Test -  ECHO -  Cardiac Cath -  Pacemaker/ICD device last checked:  Sleep Study -  CPAP -   Fasting Blood Sugar -  Checks Blood Sugar _____ times a day  Blood Thinner Instructions: Aspirin Instructions: Last Dose:  Anesthesia review:   Patient denies shortness of breath, fever, cough and chest pain at PAT appointment   Patient verbalized understanding of instructions that were given to them at the PAT appointment. Patient was also instructed that they will need to review over the PAT instructions again at home before surgery.

## 2020-11-13 ENCOUNTER — Encounter (HOSPITAL_COMMUNITY): Payer: Self-pay | Admitting: Orthopedic Surgery
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
## 2020-11-14 ENCOUNTER — Other Ambulatory Visit: Payer: Self-pay

## 2020-11-14 ENCOUNTER — Encounter (HOSPITAL_COMMUNITY)
Admission: RE | Disposition: A | Discharge status: Home / Self Care | Payer: Self-pay | Source: Home / Self Care | Attending: Orthopedic Surgery

## 2020-11-14 ENCOUNTER — Ambulatory Visit (HOSPITAL_COMMUNITY): Payer: No Typology Code available for payment source

## 2020-11-14 ENCOUNTER — Observation Stay (HOSPITAL_COMMUNITY)
Admission: RE | Admit: 2020-11-14 | Discharge: 2020-11-15 | Disposition: A | Discharge status: Home / Self Care | Payer: No Typology Code available for payment source | Attending: Orthopedic Surgery | Admitting: Orthopedic Surgery

## 2020-11-14 ENCOUNTER — Encounter (HOSPITAL_COMMUNITY): Payer: Self-pay | Admitting: Orthopedic Surgery

## 2020-11-14 ENCOUNTER — Ambulatory Visit (HOSPITAL_COMMUNITY): Payer: No Typology Code available for payment source | Admitting: Anesthesiology

## 2020-11-14 ENCOUNTER — Observation Stay (HOSPITAL_COMMUNITY): Payer: No Typology Code available for payment source

## 2020-11-14 DIAGNOSIS — Z79899 Other long term (current) drug therapy: Secondary | ICD-10-CM | POA: Insufficient documentation

## 2020-11-14 DIAGNOSIS — Z96659 Presence of unspecified artificial knee joint: Secondary | ICD-10-CM | POA: Insufficient documentation

## 2020-11-14 DIAGNOSIS — R7303 Prediabetes: Secondary | ICD-10-CM | POA: Diagnosis not present

## 2020-11-14 DIAGNOSIS — Z96649 Presence of unspecified artificial hip joint: Secondary | ICD-10-CM

## 2020-11-14 DIAGNOSIS — Z419 Encounter for procedure for purposes other than remedying health state, unspecified: Secondary | ICD-10-CM

## 2020-11-14 DIAGNOSIS — M1611 Unilateral primary osteoarthritis, right hip: Secondary | ICD-10-CM | POA: Diagnosis present

## 2020-11-14 DIAGNOSIS — Z87891 Personal history of nicotine dependence: Secondary | ICD-10-CM | POA: Diagnosis not present

## 2020-11-14 HISTORY — PX: TOTAL HIP ARTHROPLASTY: SHX124

## 2020-11-14 HISTORY — DX: Presence of unspecified artificial hip joint: Z96.649

## 2020-11-14 LAB — ABO/RH: ABO/RH(D): AB POS

## 2020-11-14 SURGERY — ARTHROPLASTY, HIP, TOTAL, ANTERIOR APPROACH
Anesthesia: Monitor Anesthesia Care | Site: Hip | Laterality: Right

## 2020-11-14 MED ORDER — PANTOPRAZOLE SODIUM 40 MG PO TBEC
40.0000 mg | DELAYED_RELEASE_TABLET | Freq: Every day | ORAL | Status: DC
  Administered 2020-11-15: 40 mg via ORAL
  Filled 2020-11-14: qty 1

## 2020-11-14 MED ORDER — BUPROPION HCL ER (XL) 150 MG PO TB24
150.0000 mg | ORAL_TABLET | Freq: Every day | ORAL | Status: DC
  Administered 2020-11-15: 150 mg via ORAL
  Filled 2020-11-14: qty 1

## 2020-11-14 MED ORDER — CELECOXIB 200 MG PO CAPS
200.0000 mg | ORAL_CAPSULE | Freq: Two times a day (BID) | ORAL | Status: DC
  Administered 2020-11-14 – 2020-11-15 (×2): 200 mg via ORAL
  Filled 2020-11-14 (×3): qty 1

## 2020-11-14 MED ORDER — 0.9 % SODIUM CHLORIDE (POUR BTL) OPTIME
TOPICAL | Status: DC | PRN
  Administered 2020-11-14: 1000 mL

## 2020-11-14 MED ORDER — TRANEXAMIC ACID-NACL 1000-0.7 MG/100ML-% IV SOLN
1000.0000 mg | INTRAVENOUS | Status: AC
  Administered 2020-11-14: 1000 mg via INTRAVENOUS
  Filled 2020-11-14: qty 100

## 2020-11-14 MED ORDER — CEFAZOLIN SODIUM-DEXTROSE 2-4 GM/100ML-% IV SOLN
2.0000 g | INTRAVENOUS | Status: AC
  Administered 2020-11-14: 2 g via INTRAVENOUS
  Filled 2020-11-14: qty 100

## 2020-11-14 MED ORDER — PHENYLEPHRINE 40 MCG/ML (10ML) SYRINGE FOR IV PUSH (FOR BLOOD PRESSURE SUPPORT)
PREFILLED_SYRINGE | INTRAVENOUS | Status: AC
  Filled 2020-11-14: qty 10

## 2020-11-14 MED ORDER — ROPINIROLE HCL 0.5 MG PO TABS: 0.5000 mg | ORAL_TABLET | Freq: Every evening | ORAL | Status: DC | PRN

## 2020-11-14 MED ORDER — MIDAZOLAM HCL 5 MG/5ML IJ SOLN
INTRAMUSCULAR | Status: DC | PRN
  Administered 2020-11-14: 2 mg via INTRAVENOUS

## 2020-11-14 MED ORDER — ROSUVASTATIN CALCIUM 10 MG PO TABS
10.0000 mg | ORAL_TABLET | Freq: Every day | ORAL | Status: DC
  Administered 2020-11-14: 10 mg via ORAL
  Filled 2020-11-14: qty 1

## 2020-11-14 MED ORDER — HYDROCODONE-ACETAMINOPHEN 5-325 MG PO TABS
1.0000 | ORAL_TABLET | ORAL | Status: DC | PRN
  Administered 2020-11-14 – 2020-11-15 (×2): 2 via ORAL
  Filled 2020-11-14 (×2): qty 2

## 2020-11-14 MED ORDER — FENTANYL CITRATE (PF) 100 MCG/2ML IJ SOLN
INTRAMUSCULAR | Status: AC
  Administered 2020-11-14: 50 ug via INTRAVENOUS
  Filled 2020-11-14: qty 2

## 2020-11-14 MED ORDER — OXYCODONE HCL 5 MG PO TABS
ORAL_TABLET | ORAL | Status: AC
  Filled 2020-11-14: qty 1

## 2020-11-14 MED ORDER — PHENYLEPHRINE 40 MCG/ML (10ML) SYRINGE FOR IV PUSH (FOR BLOOD PRESSURE SUPPORT)
PREFILLED_SYRINGE | INTRAVENOUS | Status: DC | PRN
  Administered 2020-11-14: 40 ug via INTRAVENOUS
  Administered 2020-11-14 (×2): 80 ug via INTRAVENOUS

## 2020-11-14 MED ORDER — PROPOFOL 500 MG/50ML IV EMUL
INTRAVENOUS | Status: DC | PRN
  Administered 2020-11-14: 75 ug/kg/min via INTRAVENOUS

## 2020-11-14 MED ORDER — DIPHENHYDRAMINE HCL 12.5 MG/5ML PO ELIX: 12.5000 mg | ORAL_SOLUTION | ORAL | Status: DC | PRN

## 2020-11-14 MED ORDER — STERILE WATER FOR IRRIGATION IR SOLN
Status: DC | PRN
  Administered 2020-11-14: 2000 mL

## 2020-11-14 MED ORDER — PROPOFOL 10 MG/ML IV BOLUS
INTRAVENOUS | Status: DC | PRN
  Administered 2020-11-14: 20 mg via INTRAVENOUS
  Administered 2020-11-14: 10 mg via INTRAVENOUS

## 2020-11-14 MED ORDER — ONDANSETRON HCL 4 MG/2ML IJ SOLN: 4.0000 mg | Freq: Four times a day (QID) | INTRAMUSCULAR | Status: DC | PRN

## 2020-11-14 MED ORDER — EPHEDRINE 5 MG/ML INJ
INTRAVENOUS | Status: AC
  Filled 2020-11-14: qty 5

## 2020-11-14 MED ORDER — METOCLOPRAMIDE HCL 5 MG PO TABS: 5.0000 mg | ORAL_TABLET | Freq: Three times a day (TID) | ORAL | Status: DC | PRN

## 2020-11-14 MED ORDER — FENTANYL CITRATE (PF) 100 MCG/2ML IJ SOLN: 25.0000 ug | INTRAMUSCULAR | Status: DC | PRN

## 2020-11-14 MED ORDER — DEXAMETHASONE SODIUM PHOSPHATE 10 MG/ML IJ SOLN
INTRAMUSCULAR | Status: DC | PRN
  Administered 2020-11-14: 6 mg via INTRAVENOUS

## 2020-11-14 MED ORDER — CHLORHEXIDINE GLUCONATE 0.12 % MT SOLN
15.0000 mL | Freq: Once | OROMUCOSAL | Status: AC
  Administered 2020-11-14: 15 mL via OROMUCOSAL

## 2020-11-14 MED ORDER — LORATADINE 10 MG PO TABS: 10.0000 mg | ORAL_TABLET | Freq: Every day | ORAL | Status: DC | PRN

## 2020-11-14 MED ORDER — OXYCODONE HCL 5 MG PO TABS
5.0000 mg | ORAL_TABLET | Freq: Once | ORAL | Status: AC | PRN
Start: 2020-11-14 — End: 2020-11-14
  Administered 2020-11-14: 5 mg via ORAL

## 2020-11-14 MED ORDER — VALACYCLOVIR HCL 500 MG PO TABS
1000.0000 mg | ORAL_TABLET | Freq: Three times a day (TID) | ORAL | Status: DC | PRN
  Filled 2020-11-14: qty 2

## 2020-11-14 MED ORDER — ACETAMINOPHEN 10 MG/ML IV SOLN: 1000.0000 mg | Freq: Once | INTRAVENOUS | Status: DC | PRN

## 2020-11-14 MED ORDER — BISACODYL 10 MG RE SUPP: 10.0000 mg | Freq: Every day | RECTAL | Status: DC | PRN

## 2020-11-14 MED ORDER — DICYCLOMINE HCL 10 MG PO CAPS
10.0000 mg | ORAL_CAPSULE | Freq: Two times a day (BID) | ORAL | Status: DC | PRN
  Filled 2020-11-14: qty 1

## 2020-11-14 MED ORDER — SODIUM CHLORIDE 0.9 % IV SOLN: INTRAVENOUS | Status: DC

## 2020-11-14 MED ORDER — CEFAZOLIN SODIUM-DEXTROSE 2-4 GM/100ML-% IV SOLN
2.0000 g | Freq: Four times a day (QID) | INTRAVENOUS | Status: AC
  Administered 2020-11-14 (×2): 2 g via INTRAVENOUS
  Filled 2020-11-14 (×2): qty 100

## 2020-11-14 MED ORDER — ONDANSETRON HCL 4 MG PO TABS: 4.0000 mg | ORAL_TABLET | Freq: Four times a day (QID) | ORAL | Status: DC | PRN

## 2020-11-14 MED ORDER — MELATONIN 5 MG PO TABS
5.0000 mg | ORAL_TABLET | Freq: Every day | ORAL | Status: DC
  Administered 2020-11-14: 5 mg via ORAL
  Filled 2020-11-14: qty 1

## 2020-11-14 MED ORDER — FENTANYL CITRATE (PF) 100 MCG/2ML IJ SOLN
INTRAMUSCULAR | Status: DC | PRN
  Administered 2020-11-14: 50 ug via INTRAVENOUS
  Administered 2020-11-14: 25 ug via INTRAVENOUS

## 2020-11-14 MED ORDER — LACTATED RINGERS IV SOLN: INTRAVENOUS | Status: DC

## 2020-11-14 MED ORDER — METOCLOPRAMIDE HCL 5 MG/ML IJ SOLN: 5.0000 mg | Freq: Three times a day (TID) | INTRAMUSCULAR | Status: DC | PRN

## 2020-11-14 MED ORDER — MENTHOL 3 MG MT LOZG: 1.0000 | LOZENGE | OROMUCOSAL | Status: DC | PRN

## 2020-11-14 MED ORDER — PROPOFOL 500 MG/50ML IV EMUL
INTRAVENOUS | Status: AC
  Filled 2020-11-14: qty 50

## 2020-11-14 MED ORDER — METHOCARBAMOL 500 MG PO TABS
500.0000 mg | ORAL_TABLET | Freq: Four times a day (QID) | ORAL | Status: DC | PRN
  Administered 2020-11-14: 500 mg via ORAL
  Filled 2020-11-14: qty 1

## 2020-11-14 MED ORDER — OXYCODONE HCL 5 MG/5ML PO SOLN
5.0000 mg | Freq: Once | ORAL | Status: AC | PRN
Start: 2020-11-14 — End: 2020-11-14

## 2020-11-14 MED ORDER — EPHEDRINE SULFATE-NACL 50-0.9 MG/10ML-% IV SOSY
PREFILLED_SYRINGE | INTRAVENOUS | Status: DC | PRN
  Administered 2020-11-14 (×5): 5 mg via INTRAVENOUS

## 2020-11-14 MED ORDER — FERROUS SULFATE 325 (65 FE) MG PO TABS
325.0000 mg | ORAL_TABLET | Freq: Three times a day (TID) | ORAL | Status: DC
  Administered 2020-11-15 (×2): 325 mg via ORAL
  Filled 2020-11-14 (×2): qty 1

## 2020-11-14 MED ORDER — BUPIVACAINE IN DEXTROSE 0.75-8.25 % IT SOLN
INTRATHECAL | Status: DC | PRN
  Administered 2020-11-14: 1.8 mL via INTRATHECAL

## 2020-11-14 MED ORDER — DOCUSATE SODIUM 100 MG PO CAPS
100.0000 mg | ORAL_CAPSULE | Freq: Two times a day (BID) | ORAL | Status: DC
  Administered 2020-11-14 – 2020-11-15 (×2): 100 mg via ORAL
  Filled 2020-11-14 (×2): qty 1

## 2020-11-14 MED ORDER — HYDROCODONE-ACETAMINOPHEN 7.5-325 MG PO TABS
1.0000 | ORAL_TABLET | ORAL | Status: DC | PRN
  Administered 2020-11-14 – 2020-11-15 (×2): 2 via ORAL
  Filled 2020-11-14 (×2): qty 2

## 2020-11-14 MED ORDER — DEXAMETHASONE SODIUM PHOSPHATE 10 MG/ML IJ SOLN
10.0000 mg | Freq: Once | INTRAMUSCULAR | Status: AC
  Administered 2020-11-15: 10 mg via INTRAVENOUS
  Filled 2020-11-14: qty 1

## 2020-11-14 MED ORDER — POLYETHYLENE GLYCOL 3350 17 G PO PACK: 17.0000 g | PACK | Freq: Every day | ORAL | Status: DC | PRN

## 2020-11-14 MED ORDER — TRANEXAMIC ACID-NACL 1000-0.7 MG/100ML-% IV SOLN
1000.0000 mg | Freq: Once | INTRAVENOUS | Status: AC
  Administered 2020-11-14: 1000 mg via INTRAVENOUS
  Filled 2020-11-14: qty 100

## 2020-11-14 MED ORDER — METHOCARBAMOL 1000 MG/10ML IJ SOLN
500.0000 mg | Freq: Four times a day (QID) | INTRAVENOUS | Status: DC | PRN
  Filled 2020-11-14: qty 5

## 2020-11-14 MED ORDER — ASPIRIN 81 MG PO CHEW
81.0000 mg | CHEWABLE_TABLET | Freq: Two times a day (BID) | ORAL | Status: DC
  Administered 2020-11-14 – 2020-11-15 (×2): 81 mg via ORAL
  Filled 2020-11-14 (×2): qty 1

## 2020-11-14 MED ORDER — FENTANYL CITRATE (PF) 100 MCG/2ML IJ SOLN
INTRAMUSCULAR | Status: AC
  Filled 2020-11-14: qty 2

## 2020-11-14 MED ORDER — ONDANSETRON HCL 4 MG/2ML IJ SOLN
INTRAMUSCULAR | Status: DC | PRN
  Administered 2020-11-14: 4 mg via INTRAVENOUS

## 2020-11-14 MED ORDER — ACETAMINOPHEN 160 MG/5ML PO SOLN: 1000.0000 mg | Freq: Once | ORAL | Status: DC | PRN

## 2020-11-14 MED ORDER — ACETAMINOPHEN 325 MG PO TABS: 325.0000 mg | ORAL_TABLET | Freq: Four times a day (QID) | ORAL | Status: DC | PRN

## 2020-11-14 MED ORDER — DEXAMETHASONE SODIUM PHOSPHATE 10 MG/ML IJ SOLN: 8.0000 mg | Freq: Once | INTRAMUSCULAR | Status: DC

## 2020-11-14 MED ORDER — PHENOL 1.4 % MT LIQD: 1.0000 | OROMUCOSAL | Status: DC | PRN

## 2020-11-14 MED ORDER — ORAL CARE MOUTH RINSE: 15.0000 mL | Freq: Once | OROMUCOSAL | Status: AC

## 2020-11-14 MED ORDER — POVIDONE-IODINE 10 % EX SWAB
2.0000 "application " | Freq: Once | CUTANEOUS | Status: AC
  Administered 2020-11-14: 2 via TOPICAL

## 2020-11-14 MED ORDER — DM-PHENYLEPHRINE-ACETAMINOPHEN 10-5-325 MG/15ML PO LIQD: 15.0000 mL | Freq: Every evening | ORAL | Status: DC | PRN

## 2020-11-14 MED ORDER — MIDAZOLAM HCL 2 MG/2ML IJ SOLN
INTRAMUSCULAR | Status: AC
  Filled 2020-11-14: qty 2

## 2020-11-14 MED ORDER — MORPHINE SULFATE (PF) 2 MG/ML IV SOLN
0.5000 mg | INTRAVENOUS | Status: DC | PRN
  Administered 2020-11-14 (×2): 1 mg via INTRAVENOUS
  Filled 2020-11-14 (×2): qty 1

## 2020-11-14 MED ORDER — ACETAMINOPHEN 500 MG PO TABS: 1000.0000 mg | ORAL_TABLET | Freq: Once | ORAL | Status: DC | PRN

## 2020-11-14 MED ORDER — BENZONATATE 100 MG PO CAPS: 200.0000 mg | ORAL_CAPSULE | Freq: Three times a day (TID) | ORAL | Status: DC | PRN

## 2020-11-14 SURGICAL SUPPLY — 40 items
BAG COUNTER SPONGE SURGICOUNT (BAG) ×2 IMPLANT
BAG DECANTER FOR FLEXI CONT (MISCELLANEOUS) IMPLANT
BAG ZIPLOCK 12X15 (MISCELLANEOUS) IMPLANT
BLADE SAG 18X100X1.27 (BLADE) ×2 IMPLANT
COVER PERINEAL POST (MISCELLANEOUS) ×2 IMPLANT
COVER SURGICAL LIGHT HANDLE (MISCELLANEOUS) ×2 IMPLANT
CUP ACETBLR 52 OD PINNACLE (Hips) ×2 IMPLANT
DERMABOND ADVANCED (GAUZE/BANDAGES/DRESSINGS) ×1
DERMABOND ADVANCED .7 DNX12 (GAUZE/BANDAGES/DRESSINGS) ×1 IMPLANT
DRAPE FOOT SWITCH (DRAPES) ×2 IMPLANT
DRAPE STERI IOBAN 125X83 (DRAPES) ×2 IMPLANT
DRAPE U-SHAPE 47X51 STRL (DRAPES) ×4 IMPLANT
DRESSING AQUACEL AG SP 3.5X10 (GAUZE/BANDAGES/DRESSINGS) ×1 IMPLANT
DRSG AQUACEL AG SP 3.5X10 (GAUZE/BANDAGES/DRESSINGS) ×2
DURAPREP 26ML APPLICATOR (WOUND CARE) ×2 IMPLANT
ELECT REM PT RETURN 15FT ADLT (MISCELLANEOUS) ×2 IMPLANT
ELIMINATOR HOLE APEX DEPUY (Hips) ×2 IMPLANT
GLOVE SURG ENC MOIS LTX SZ6 (GLOVE) ×4 IMPLANT
GLOVE SURG UNDER LTX SZ7.5 (GLOVE) ×2 IMPLANT
GLOVE SURG UNDER POLY LF SZ6.5 (GLOVE) ×2 IMPLANT
GLOVE SURG UNDER POLY LF SZ7.5 (GLOVE) ×4 IMPLANT
GOWN STRL REUS W/TWL LRG LVL3 (GOWN DISPOSABLE) ×4 IMPLANT
HEAD CERAMIC DELTA 36 PLUS 1.5 (Hips) ×2 IMPLANT
HOLDER FOLEY CATH W/STRAP (MISCELLANEOUS) ×2 IMPLANT
KIT TURNOVER KIT A (KITS) ×2 IMPLANT
LINER NEUTRAL 52X36MM PLUS 4 (Liner) ×2 IMPLANT
PACK ANTERIOR HIP CUSTOM (KITS) ×2 IMPLANT
PENCIL SMOKE EVACUATOR (MISCELLANEOUS) ×2 IMPLANT
SCREW 6.5MMX30MM (Screw) ×2 IMPLANT
SPONGE T-LAP 18X18 ~~LOC~~+RFID (SPONGE) ×4 IMPLANT
STEM FEM ACTIS HIGH SZ3 (Stem) ×2 IMPLANT
SUT MNCRL AB 4-0 PS2 18 (SUTURE) ×2 IMPLANT
SUT STRATAFIX 0 PDS 27 VIOLET (SUTURE) ×2
SUT VIC AB 1 CT1 36 (SUTURE) ×6 IMPLANT
SUT VIC AB 2-0 CT1 27 (SUTURE) ×2
SUT VIC AB 2-0 CT1 TAPERPNT 27 (SUTURE) ×2 IMPLANT
SUTURE STRATFX 0 PDS 27 VIOLET (SUTURE) ×1 IMPLANT
TRAY FOLEY MTR SLVR 14FR STAT (SET/KITS/TRAYS/PACK) ×2 IMPLANT
TUBE SUCTION HIGH CAP CLEAR NV (SUCTIONS) ×2 IMPLANT
WATER STERILE IRR 1000ML POUR (IV SOLUTION) ×2 IMPLANT

## 2020-11-14 NOTE — Anesthesia Procedure Notes (Signed)
Spinal  Patient location during procedure: OR Start time: 11/14/2020 11:42 AM End time: 11/14/2020 11:45 AM Reason for block: surgical anesthesia Staffing Performed: anesthesiologist  Anesthesiologist: Oleta Mouse, MD Preanesthetic Checklist Completed: patient identified, IV checked, risks and benefits discussed, surgical consent, monitors and equipment checked, pre-op evaluation and timeout performed Spinal Block Patient position: sitting Prep: DuraPrep Patient monitoring: heart rate, cardiac monitor, continuous pulse ox and blood pressure Approach: midline Location: L3-4 Injection technique: single-shot Needle Needle type: Pencan  Needle gauge: 24 G Needle length: 9 cm Assessment Sensory level: T6 Events: CSF return

## 2020-11-14 NOTE — Transfer of Care (Signed)
Immediate Anesthesia Transfer of Care Note  Patient: Brenda Allen  Procedure(s) Performed: TOTAL HIP ARTHROPLASTY ANTERIOR APPROACH (Right: Hip)  Patient Location: PACU  Anesthesia Type:Spinal  Level of Consciousness: awake  Airway & Oxygen Therapy: Patient Spontanous Breathing and Patient connected to face mask oxygen  Post-op Assessment: Report given to RN and Post -op Vital signs reviewed and stable  Post vital signs: Reviewed and stable  Last Vitals:  Vitals Value Taken Time  BP 130/83 11/14/20 1415  Temp 36.3 C 11/14/20 1322  Pulse 55 11/14/20 1429  Resp 16 11/14/20 1429  SpO2 100 % 11/14/20 1429  Vitals shown include unvalidated device data.  Last Pain:  Vitals:   11/14/20 1330  TempSrc:   PainSc: 0-No pain      Patients Stated Pain Goal: 3 ([id number])  Complications: No notable events documented.

## 2020-11-14 NOTE — Op Note (Signed)
NAME:  Brenda Allen                ACCOUNT NO.: [id number]      MEDICAL RECORD NO.: [id number]      FACILITY:  [facility]      PHYSICIAN:  Mauri Pole  DATE OF BIRTH:  Sep 15, 1960     DATE OF PROCEDURE:  11/14/2020                                 OPERATIVE REPORT         PREOPERATIVE DIAGNOSIS: Right  hip osteoarthritis.      POSTOPERATIVE DIAGNOSIS:  Right hip osteoarthritis.      PROCEDURE:  Right total hip replacement through an anterior approach   utilizing DePuy THR system, component size 52 mm pinnacle cup, a size 36+4 neutral   Altrex liner, a size 3 Hi Actis stem with a 36+1.5 delta ceramic   ball.      SURGEON:  Pietro Cassis. Alvan Dame, M.D.      ASSISTANT:  Costella Hatcher, PA-C     ANESTHESIA:  Spinal.      SPECIMENS:  None.      COMPLICATIONS:  None.      BLOOD LOSS:  250 cc     DRAINS:  None.      INDICATION OF THE PROCEDURE:  Brenda Allen is a 60 y.o. female who had   presented to office for evaluation of right hip pain.  Radiographs revealed   progressive degenerative changes with bone-on-bone   articulation of the  hip joint, including subchondral cystic changes and osteophytes.  The patient had painful limited range of   motion significantly affecting their overall quality of life and function.  The patient was failing to    respond to conservative measures including medications and/or injections and activity modification and at this point was ready   to proceed with more definitive measures.  Consent was obtained for   benefit of pain relief.  Specific risks of infection, DVT, component   failure, dislocation, neurovascular injury, and need for revision surgery were reviewed in the office as well discussion of   the anterior versus posterior approach were reviewed.     PROCEDURE IN DETAIL:  The patient was brought to operative theater.   Once adequate anesthesia, preoperative antibiotics, 2 gm of Ancef, 1 gm of Tranexamic Acid, and 10 mg  of Decadron were administered, the patient was positioned supine on the [employer] table.  Once the patient was safely positioned with adequate padding of boney prominences we predraped out the hip, and used fluoroscopy to confirm orientation of the pelvis.      The right hip was then prepped and draped from proximal iliac crest to   mid thigh with a shower curtain technique.      Time-out was performed identifying the patient, planned procedure, and the appropriate extremity.     An incision was then made 2 cm lateral to the   anterior superior iliac spine extending over the orientation of the   tensor fascia lata muscle and sharp dissection was carried down to the   fascia of the muscle.      The fascia was then incised.  The muscle belly was identified and swept   laterally and retractor placed along the superior neck.  Following   cauterization of the circumflex vessels and removing some pericapsular  fat, a second cobra retractor was placed on the inferior neck.  A T-capsulotomy was made along the line of the   superior neck to the trochanteric fossa, then extended proximally and   distally.  Tag sutures were placed and the retractors were then placed   intracapsular.  We then identified the trochanteric fossa and   orientation of my neck cut and then made a neck osteotomy with the femur on traction.  The femoral   head was removed without difficulty or complication.  Traction was let   off and retractors were placed posterior and anterior around the   acetabulum.      The labrum and foveal tissue were debrided.  I began reaming with a 44 mm   reamer and reamed up to 51 mm reamer with good bony bed preparation and a 52 mm  cup was chosen.  The final 52 mm Pinnacle cup was then impacted under fluoroscopy to confirm the depth of penetration and orientation with respect to   Abduction and forward flexion.  A screw was placed into the ilium.  The final   36+4 neutral Altrex liner was  impacted with good visualized rim fit.  The cup was positioned anatomically within the acetabular portion of the pelvis.      At this point, the femur was rolled to 100 degrees.  Further capsule was   released off the inferior aspect of the femoral neck.  I then   released the superior capsule proximally.  With the leg in a neutral position the hook was placed laterally   along the femur under the vastus lateralis origin and elevated manually and then held in position using the hook attachment on the bed.  The leg was then extended and adducted with the leg rolled to 100   degrees of external rotation.  Retractors were placed along the medial calcar and posteriorly over the greater trochanter.  Once the proximal femur was fully   exposed, I used a box osteotome to set orientation.  I then began   broaching with the starting chili pepper broach and passed this by hand and then broached up to 3.  With the 3 broach in place I chose a high offset neck and did several trial reductions.  The offset was appropriate, leg lengths   appeared to be equal best matched with the +1.5 head ball trial confirmed radiographically.   Given these findings, I went ahead and dislocated the hip, repositioned all   retractors and positioned the right hip in the extended and abducted position.  The final 3 Hi Actis stem was   chosen and it was impacted down to the level of neck cut.  Based on this   and the trial reductions, a final 36+1.5 delta ceramic ball was chosen and   impacted onto a clean and dry trunnion, and the hip was reduced.  The   hip had been irrigated throughout the case again at this point.  I did   reapproximate the superior capsular leaflet to the anterior leaflet   using #1 Vicryl.  The fascia of the   tensor fascia lata muscle was then reapproximated using #1 Vicryl and #0 Stratafix sutures.  The   remaining wound was closed with 2-0 Vicryl and running 4-0 Monocryl.   The hip was cleaned, dried,  and dressed sterilely using Dermabond and   Aquacel dressing.  The patient was then brought   to recovery room in stable condition tolerating the procedure  well.    Costella Hatcher, PA-C was present for the entirety of the case involved from   preoperative positioning, perioperative retractor management, general   facilitation of the case, as well as primary wound closure as assistant.            Pietro Cassis Alvan Dame, M.D.        11/14/2020 11:47 AM

## 2020-11-14 NOTE — Anesthesia Preprocedure Evaluation (Signed)
Anesthesia Evaluation  Patient identified by MRN, date of birth, ID band Patient awake    Reviewed: Allergy & Precautions, NPO status , Patient's Chart, lab work & pertinent test results  History of Anesthesia Complications Negative for: history of anesthetic complications  Airway Mallampati: III  TM Distance: >3 FB Neck ROM: Full    Dental  (+) Dental Advisory Given, Teeth Intact   Pulmonary former smoker,  Covid-19 Nucleic Acid Test Results No results found for: SARSCOV2NAA, SARSCOV2    breath sounds clear to auscultation       Cardiovascular negative cardio ROS   Rhythm:Regular     Neuro/Psych PSYCHIATRIC DISORDERS Depression negative neurological ROS     GI/Hepatic Neg liver ROS, GERD  Medicated and Controlled,  Endo/Other    Renal/GU Renal diseaseLab Results      Component                Value               Date                      CREATININE               0.88                11/01/2020                Musculoskeletal  (+) Arthritis ,   Abdominal   Peds  Hematology negative hematology ROS (+) Lab Results      Component                Value               Date                      WBC                      5.0                 11/01/2020                HGB                      13.1                11/01/2020                HCT                      40.7                11/01/2020                MCV                      93.1                11/01/2020                PLT                      189                 11/01/2020              Anesthesia Other Findings   Reproductive/Obstetrics  Anesthesia Physical Anesthesia Plan  ASA: 2  Anesthesia Plan: MAC and Spinal   Post-op Pain Management:    Induction:   PONV Risk Score and Plan: Propofol infusion and Treatment may vary due to age or medical condition  Airway Management Planned: Nasal  Cannula  Additional Equipment: None  Intra-op Plan:   Post-operative Plan:   Informed Consent: I have reviewed the patients History and Physical, chart, labs and discussed the procedure including the risks, benefits and alternatives for the proposed anesthesia with the patient or authorized representative who has indicated his/her understanding and acceptance.     Dental advisory given  Plan Discussed with: CRNA and Anesthesiologist  Anesthesia Plan Comments:         Anesthesia Quick Evaluation

## 2020-11-14 NOTE — Discharge Instructions (Signed)

## 2020-11-14 NOTE — Plan of Care (Signed)
  Problem: Coping: Goal: Level of anxiety will decrease Outcome: Progressing   Problem: Pain Managment: Goal: General experience of comfort will improve Outcome: Progressing   Problem: Safety: Goal: Ability to remain free from injury will improve Outcome: Progressing   Problem: Skin Integrity: Goal: Risk for impaired skin integrity will decrease Outcome: Progressing   

## 2020-11-14 NOTE — Evaluation (Signed)
Physical Therapy Evaluation Patient Details Name: Briyana Seeburger MRN: [id number] DOB: [birth year] Today's Date: 11/14/2020   History of Present Illness  Pt is 60 yo female s/p R anterior THA on 11/14/20 with hx including depression, GERD, OA, HLD, prior TKA 2016  Clinical Impression  Pt is s/p THA resulting in the deficits listed below (see PT Problem List). At baseline, pt is independent.  She will have 24 hr support at d/c during initial recovery.  Pt saw for PT evaluation on DOS and making excellent progress.  She was eager to mobilize and pain decreased significantly with mobility.  Pt ambulated 200' with min guard and min cues for safe gait and transfer.  Pt with excellent rehab potential.  Pt will benefit from skilled PT to increase their independence and safety with mobility to allow discharge to the venue listed below.      Follow Up Recommendations Follow surgeon's recommendation for DC plan and follow-up therapies;Supervision for mobility/OOB    Equipment Recommendations  3in1 (PT)    Recommendations for Other Services       Precautions / Restrictions Precautions Precautions: Fall Restrictions Weight Bearing Restrictions: Yes RLE Weight Bearing: Weight bearing as tolerated      Mobility  Bed Mobility Overal bed mobility: Needs Assistance Bed Mobility: Supine to Sit     Supine to sit: Min assist;HOB elevated     General bed mobility comments: Educated on use of gait belt to assist R LE and provided cues; min A initially progressing to supervision    Transfers Overall transfer level: Needs assistance Equipment used: Rolling walker (2 wheeled) Transfers: Sit to/from Stand Sit to Stand: Min guard         General transfer comment: cues for hand placement and R LE management  Ambulation/Gait Ambulation/Gait assistance: Min guard Gait Distance (Feet): 200 Feet Assistive device: Rolling walker (2 wheeled) Gait Pattern/deviations: Step-to pattern;Step-through  pattern;Decreased stride length Gait velocity: decreased   General Gait Details: Intial antalgic step to R gait progressing to step through gait with cues; reports feels good to walk  Stairs            Wheelchair Mobility    Modified Rankin (Stroke Patients Only)       Balance Overall balance assessment: Needs assistance Sitting-balance support: No upper extremity supported Sitting balance-Leahy Scale: Good     Standing balance support: Bilateral upper extremity supported;No upper extremity supported Standing balance-Leahy Scale: Good Standing balance comment: RW to ambulate but could static stand wtihout AD                             Pertinent Vitals/Pain Pain Assessment: 0-10 Pain Score: 2  (4/10 pre; 2/10 post) Pain Location: R hip Pain Descriptors / Indicators: Sore;Discomfort Pain Intervention(s): Limited activity within patient's tolerance;Monitored during session;Premedicated before session;Repositioned;Ice applied    Home Living Family/patient expects to be discharged to:: Private residence Living Arrangements: Spouse/significant other Available Help at Discharge: Family;Available 24 hours/day Type of Home: House Home Access: Stairs to enter Entrance Stairs-Rails: Left Entrance Stairs-Number of Steps: 3 Home Layout: One level Home Equipment: Walker - 2 wheels;Cane - single point      Prior Function Level of Independence: Independent         Comments: Pt independent with ADLs, IADL, driving , and community ambulation     Hand Dominance        Extremity/Trunk Assessment   Upper Extremity Assessment Upper Extremity Assessment: Overall [facility]  for tasks assessed    Lower Extremity Assessment Lower Extremity Assessment: RLE deficits/detail RLE Deficits / Details: Expected post op changes.  ROM WFL; MMT ankle 5/5, knee 3/5 not further tested, hip 1/5 RLE Sensation: WNL    Cervical / Trunk Assessment Cervical / Trunk Assessment:  Normal  Communication   Communication: No difficulties  Cognition Arousal/Alertness: Awake/alert Behavior During Therapy: WFL for tasks assessed/performed Overall Cognitive Status: Within Functional Limits for tasks assessed                                        General Comments General comments (skin integrity, edema, etc.): VSS on RA; encouraged to perform ankle pumps and quad sets tonight; discussed could ambulate with nursing if desired    Exercises Total Joint Exercises Ankle Circles/Pumps: AROM;Both;5 reps;Seated Quad Sets: AROM;Both;5 reps;Seated   Assessment/Plan    PT Assessment Patient needs continued PT services  PT Problem List Decreased strength;Decreased mobility;Decreased range of motion;Decreased activity tolerance;Decreased balance;Decreased knowledge of use of DME;Pain       PT Treatment Interventions DME instruction;Therapeutic activities;Modalities;Gait training;Patient/family education;Therapeutic exercise;Stair training;Functional mobility training;Balance training    PT Goals (Current goals can be found in the Care Plan section)  Acute Rehab PT Goals Patient Stated Goal: return home PT Goal Formulation: With patient/family Time For Goal Achievement: 11/28/20 Potential to Achieve Goals: Good    Frequency 7X/week   Barriers to discharge        Co-evaluation               AM-PAC PT "6 Clicks" Mobility  Outcome Measure Help needed turning from your back to your side while in a flat bed without using bedrails?: A Little Help needed moving from lying on your back to sitting on the side of a flat bed without using bedrails?: A Little Help needed moving to and from a bed to a chair (including a wheelchair)?: A Little Help needed standing up from a chair using your arms (e.g., wheelchair or bedside chair)?: A Little Help needed to walk in hospital room?: A Little Help needed climbing 3-5 steps with a railing? : A Little 6 Click  Score: 18    End of Session Equipment Utilized During Treatment: Gait belt Activity Tolerance: Patient tolerated treatment well Patient left: with chair alarm set;in chair;with call bell/phone within reach;with family/visitor present Nurse Communication: Mobility status PT Visit Diagnosis: Other abnormalities of gait and mobility (R26.89);Muscle weakness (generalized) (M62.81)    Time: [id number] PT Time Calculation (min) (ACUTE ONLY): 25 min   Charges:   PT Evaluation $PT Eval Low Complexity: 1 Low PT Treatments $Gait Training: 8-22 mins        Abran Richard, PT Acute Rehab Services Pager [phone] [facility] 11/14/2020, 6:33 PM

## 2020-11-14 NOTE — Interval H&P Note (Signed)
History and Physical Interval Note:  11/14/2020 10:09 AM  Brenda Allen  has presented today for surgery, with the diagnosis of Right hip osteoarthritis.  The various methods of treatment have been discussed with the patient and family. After consideration of risks, benefits and other options for treatment, the patient has consented to  Procedure(s): TOTAL HIP ARTHROPLASTY ANTERIOR APPROACH (Right) as a surgical intervention.  The patient's history has been reviewed, patient examined, no change in status, stable for surgery.  I have reviewed the patient's chart and labs.  Questions were answered to the patient's satisfaction.     Mauri Pole

## 2020-11-15 DIAGNOSIS — M1611 Unilateral primary osteoarthritis, right hip: Secondary | ICD-10-CM | POA: Diagnosis not present

## 2020-11-15 LAB — BASIC METABOLIC PANEL
Anion gap: 9 (ref 5–15)
BUN: 10 mg/dL (ref 6–20)
CO2: 24 mmol/L (ref 22–32)
Calcium: 8.9 mg/dL (ref 8.9–10.3)
Chloride: 105 mmol/L (ref 98–111)
Creatinine, Ser: 0.82 mg/dL (ref 0.44–1.00)
GFR, Estimated: >60 mL/min (ref >60)
Glucose, Bld: 132 mg/dL — ABNORMAL HIGH (ref 70–99)
Potassium: 4.5 mmol/L (ref 3.5–5.1)
Sodium: 138 mmol/L (ref 135–145)

## 2020-11-15 LAB — CBC
HCT: 34.5 % — ABNORMAL LOW (ref 36.0–46.0)
Hemoglobin: 11 g/dL — ABNORMAL LOW (ref 12.0–15.0)
MCH: 30.1 pg (ref 26.0–34.0)
MCHC: 31.9 g/dL (ref 30.0–36.0)
MCV: 94.5 fL (ref 80.0–100.0)
Platelets: 172 10*3/uL (ref 150–400)
RBC: 3.65 MIL/uL — ABNORMAL LOW (ref 3.87–5.11)
RDW: 13.8 % (ref 11.5–15.5)
WBC: 9.9 10*3/uL (ref 4.0–10.5)
nRBC: 0 % (ref 0.0–0.2)

## 2020-11-15 MED ORDER — CELECOXIB 200 MG PO CAPS: 200.0000 mg | ORAL_CAPSULE | Freq: Two times a day (BID) | ORAL | 0 refills | Status: DC

## 2020-11-15 NOTE — Progress Notes (Signed)
Discharge package printed and instructions given to pt. Pt verbalizes understanding. 

## 2020-11-15 NOTE — Progress Notes (Signed)
Physical Therapy Treatment Patient Details Name: Brenda Allen MRN: [id number] DOB: [birth year] Today's Date: 11/15/2020    History of Present Illness Pt is 60 yo female s/p R anterior THA on 11/14/20 with hx including depression, GERD, OA, HLD, prior TKA 2016    PT Comments    Pt is POD # 1 and is progressing well.  Pt demonstrates safe gait & transfers in order to return home from PT perspective once discharged by MD.  While in hospital, will continue to benefit from PT for skilled therapy to advance mobility and exercises.  Pt ambulated 120' with RW and performed 4 steps safely. Good pain control and good safety awareness.     Follow Up Recommendations  Follow surgeon's recommendation for DC plan and follow-up therapies;Supervision for mobility/OOB     Equipment Recommendations  3in1 (PT)    Recommendations for Other Services       Precautions / Restrictions Precautions Precautions: Fall Restrictions Weight Bearing Restrictions: Yes RLE Weight Bearing: Weight bearing as tolerated    Mobility  Bed Mobility Overal bed mobility: Needs Assistance Bed Mobility: Sit to Supine       Sit to supine: Supervision   General bed mobility comments: Used gait belt to assist R LE    Transfers Overall transfer level: Needs assistance Equipment used: Rolling walker (2 wheeled) Transfers: Sit to/from Stand Sit to Stand: Supervision         General transfer comment: cues for hand placement and R LE management  Ambulation/Gait Ambulation/Gait assistance: Supervision Gait Distance (Feet): 120 Feet Assistive device: Rolling walker (2 wheeled)       General Gait Details: Intial antalgic step to R gait progressing to step through gait with cues; reports feels good to walk   Stairs Stairs: Yes Stairs assistance: Min guard Stair Management: One rail Left;Step to pattern;Forwards Number of Stairs: 4 General stair comments: cues for sequence   Wheelchair Mobility     Modified Rankin (Stroke Patients Only)       Balance Overall balance assessment: Needs assistance Sitting-balance support: No upper extremity supported Sitting balance-Leahy Scale: Good     Standing balance support: Bilateral upper extremity supported;No upper extremity supported Standing balance-Leahy Scale: Good Standing balance comment: RW to ambulate but could static stand wtihout AD                            Cognition Arousal/Alertness: Awake/alert Behavior During Therapy: WFL for tasks assessed/performed Overall Cognitive Status: Within Functional Limits for tasks assessed                                        Exercises Total Joint Exercises Ankle Circles/Pumps: AROM;Both;5 reps;10 reps Quad Sets: AROM;Both;10 reps;Supine Heel Slides: AAROM;Right;5 reps;Supine (demonstrated aarom with gait belt) Hip ABduction/ADduction: AAROM;Right;5 reps;Supine (demonstrated aarom with gait belt) Long Arc Quad: AROM;Right;10 reps;Seated Other Exercises Other Exercises: standing hamstring curl with RW and min guard - R side x 5    General Comments  Educated on safe ice use, no pivots, car transfers, and TED hose during day. Also, encouraged walking every 1-2 hours during day. Educated on HEP with focus on mobility the first weeks. Discussed doing exercises within pain control and if pain increasing could decreased ROM, reps, and stop exercises as needed. Encouraged to perform quad sets and ankle pumps frequently for blood flow and to promote  full knee extension.       Pertinent Vitals/Pain Pain Assessment: No/denies pain Pain Score: 3  Pain Location: R hip Pain Descriptors / Indicators: Sore;Discomfort ("like somebody punched me") Pain Intervention(s): Limited activity within patient's tolerance;Monitored during session;Premedicated before session;Repositioned;Ice applied    Home Living                      Prior Function            PT  Goals (current goals can now be found in the care plan section) Progress towards PT goals: Progressing toward goals    Frequency    7X/week      PT Plan Current plan remains appropriate    Co-evaluation              AM-PAC PT "6 Clicks" Mobility   Outcome Measure  Help needed turning from your back to your side while in a flat bed without using bedrails?: None Help needed moving from lying on your back to sitting on the side of a flat bed without using bedrails?: A Little Help needed moving to and from a bed to a chair (including a wheelchair)?: A Little Help needed standing up from a chair using your arms (e.g., wheelchair or bedside chair)?: A Little Help needed to walk in hospital room?: A Little Help needed climbing 3-5 steps with a railing? : A Little 6 Click Score: 19    End of Session Equipment Utilized During Treatment: Gait belt Activity Tolerance: Patient tolerated treatment well Patient left: in bed;with call bell/phone within reach;with bed alarm set Nurse Communication: Mobility status PT Visit Diagnosis: Other abnormalities of gait and mobility (R26.89);Muscle weakness (generalized) (M62.81)     Time: [id number] PT Time Calculation (min) (ACUTE ONLY): 28 min  Charges:  $Gait Training: 8-22 mins $Therapeutic Exercise: 8-22 mins                     Abran Richard, PT Acute Rehab Services Pager [phone] [facility] 11/15/2020, 11:32 AM

## 2020-11-15 NOTE — Plan of Care (Signed)
  Problem: Activity: Goal: Risk for activity intolerance will decrease Outcome: Progressing   Problem: Pain Managment: Goal: General experience of comfort will improve Outcome: Progressing   Problem: Safety: Goal: Ability to remain free from injury will improve Outcome: Progressing   

## 2020-11-15 NOTE — Anesthesia Postprocedure Evaluation (Signed)
Anesthesia Post Note  Patient: Brenda Allen  Procedure(s) Performed: TOTAL HIP ARTHROPLASTY ANTERIOR APPROACH (Right: Hip)     Patient location during evaluation: PACU Anesthesia Type: MAC and Spinal Level of consciousness: awake and alert Pain management: pain level controlled Vital Signs Assessment: post-procedure vital signs reviewed and stable Respiratory status: spontaneous breathing, nonlabored ventilation, respiratory function stable and patient connected to nasal cannula oxygen Cardiovascular status: stable and blood pressure returned to baseline Postop Assessment: no apparent nausea or vomiting Anesthetic complications: no   No notable events documented.  Last Vitals:  Vitals:   11/15/20 0028 11/15/20 0517  BP: 112/63 107/63  Pulse: 73 66  Resp: 18 18  Temp: 36.7 C 36.7 C  SpO2: 95% 96%    Last Pain:  Vitals:   11/15/20 0745  TempSrc:   PainSc: 0-No pain                 Edmund Rick

## 2020-11-15 NOTE — Progress Notes (Addendum)
   Subjective: 1 Day Post-Op Procedure(s) (LRB): TOTAL HIP ARTHROPLASTY ANTERIOR APPROACH (Right) Patient reports pain as mild.   Patient seen in rounds with Dr. Alvan Dame. Patient is well, and has had no acute complaints or problems.  No acute events overnight. Ambulated 200 feet with PT yesterday.  We will continue  therapy today.   Objective: Vital signs in last 24 hours: Temp:  [97.4 F (36.3 C)-99 F (37.2 C)] 98.1 F (36.7 C) (08/17 0517) Pulse Rate:  [42-81] 66 (08/17 0517) Resp:  [11-20] 18 (08/17 0517) BP: (98-160)/(60-108) 107/63 (08/17 0517) SpO2:  [92 %-100 %] 96 % (08/17 0517) Weight:  [92.5 kg] 92.5 kg (08/16 0904)  Intake/Output from previous day:  Intake/Output Summary (Last 24 hours) at 11/15/2020 0807 Last data filed at 11/15/2020 0600 Gross per 24 hour  Intake 3341.88 ml  Output 750 ml  Net 2591.88 ml     Intake/Output this shift: No intake/output data recorded.  Labs: Recent Labs    11/15/20 0329  HGB 11.0*   Recent Labs    11/15/20 0329  WBC 9.9  RBC 3.65*  HCT 34.5*  PLT 172   Recent Labs    11/15/20 0329  NA 138  K 4.5  CL 105  CO2 24  BUN 10  CREATININE 0.82  GLUCOSE 132*  CALCIUM 8.9   No results for input(s): LABPT, INR in the last 72 hours.  Exam: General - Patient is Alert and Oriented Extremity - Neurologically intact Sensation intact distally Intact pulses distally Dorsiflexion/Plantar flexion intact Dressing - dressing C/D/I Motor Function - intact, moving foot and toes well on exam.   Past Medical History:  Diagnosis Date   Allergy    seasonal allergies   Depression    on meds   Diverticulosis    Dysphagia    GERD (gastroesophageal reflux disease)    on meds   GERD with esophagitis    History of colonic polyps    Hyperlipidemia    on meds   Kidney infection 10/14/2018   Osteoarthritis    bilateral hands and knees   Pre-diabetes     Assessment/Plan: 1 Day Post-Op Procedure(s) (LRB): TOTAL HIP  ARTHROPLASTY ANTERIOR APPROACH (Right) Active Problems:   S/P right total hip arthroplasty  Estimated body mass index is 35 kg/m as calculated from the following:   Height as of this encounter: '5\' 4"'$  (1.626 m).   Weight as of this encounter: 92.5 kg. Advance diet Up with therapy D/C IV fluids  DVT Prophylaxis - Aspirin Weight bearing as tolerated.  Plan is to go Home after hospital stay. Plan for discharge today following 1 session of PT as long as she is meeting her goals. Follow up in the office in 2 weeks.   Meds sent at pre-op visit in anticipation of same day discharge.  Sent with Norco 1-2 q6h, robaxin, aspirin, colace, miralax.   Griffith Citron, PA-C Orthopedic Surgery [phone] 11/15/2020, 8:07 AM

## 2020-11-15 NOTE — TOC Transition Note (Signed)
Transition of Care [facility]) - CM/SW Discharge Note  Patient Details  Name: Brenda Allen MRN: [id number] Date of Birth: [birth year]  Transition of Care [facility]) CM/SW Contact:  Sherie Don, LCSW Phone Number: 11/15/2020, 11:06 AM  Clinical Narrative: Patient is expected to discharge home after working with PT. CSW met with patient to confirm discharge plan and needs. Patient will discharge home with a home exercise program (HEP). Patient has a rolling walker at home, but will need a 3N1. [employer] unable to provide DME today, so CSW made DME referral to [facility] with [employer]. [employer] to deliver 3N1 to patient's room. TOC signing off.  Final next level of care: Home/Self Care Barriers to Discharge: No Barriers Identified  Patient Goals and CMS Choice Patient states their goals for this hospitalization and ongoing recovery are:: Discharge home with HEP CMS Medicare.gov Compare Post Acute Care list provided to:: Patient Choice offered to / list presented to : Patient  Discharge Plan and Services         DME Arranged: 3-N-1 DME Agency: [employer] Date DME Agency Contacted: 11/15/20 Time DME Agency Contacted: 67 Representative spoke with at DME Agency: [employer]  Readmission Risk Interventions No flowsheet data found.

## 2020-11-16 ENCOUNTER — Encounter (HOSPITAL_COMMUNITY): Payer: Self-pay | Admitting: Orthopedic Surgery

## 2020-11-22 NOTE — Discharge Summary (Signed)
Physician Discharge Summary   Patient ID: Brenda Allen MRN: [id number] DOB/AGE: November 18, 1960 60 y.o.  Admit date: 11/14/2020 Discharge date: 11/15/2020  Primary Diagnosis: Right  hip osteoarthritis.  Admission Diagnoses:  Past Medical History:  Diagnosis Date   Allergy    seasonal allergies   Depression    on meds   Diverticulosis    Dysphagia    GERD (gastroesophageal reflux disease)    on meds   GERD with esophagitis    History of colonic polyps    Hyperlipidemia    on meds   Kidney infection 10/14/2018   Osteoarthritis    bilateral hands and knees   Pre-diabetes    Discharge Diagnoses:   Active Problems:   S/P right total hip arthroplasty  Estimated body mass index is 35 kg/m as calculated from the following:   Height as of this encounter: '5\' 4"'$  (1.626 m).   Weight as of this encounter: 92.5 kg.  Procedure:  Procedure(s) (LRB): TOTAL HIP ARTHROPLASTY ANTERIOR APPROACH (Right)   Consults: None  HPI:  Brenda Allen is a 60 y.o. female who had   presented to office for evaluation of right hip pain.  Radiographs revealed   progressive degenerative changes with bone-on-bone   articulation of the  hip joint, including subchondral cystic changes and osteophytes.  The patient had painful limited range of   motion significantly affecting their overall quality of life and function.  The patient was failing to    respond to conservative measures including medications and/or injections and activity modification and at this point was ready   to proceed with more definitive measures.  Consent was obtained for   benefit of pain relief.  Specific risks of infection, DVT, component   failure, dislocation, neurovascular injury, and need for revision surgery were reviewed in the office as well discussion of   the anterior versus posterior approach were reviewed.  Laboratory Data: Admission on 11/14/2020, Discharged on 11/15/2020  Component Date Value Ref Range Status    ABO/RH(D) 11/14/2020    Final                   Value:AB POS Performed at [facility], [place] [address], [place], [place] [address]    WBC 11/15/2020 9.9  4.0 - 10.5 K/uL Final   RBC 11/15/2020 3.65 (A) 3.87 - 5.11 MIL/uL Final   Hemoglobin 11/15/2020 11.0 (A) 12.0 - 15.0 g/dL Final   HCT 11/15/2020 34.5 (A) 36.0 - 46.0 % Final   MCV 11/15/2020 94.5  80.0 - 100.0 fL Final   MCH 11/15/2020 30.1  26.0 - 34.0 pg Final   MCHC 11/15/2020 31.9  30.0 - 36.0 g/dL Final   RDW 11/15/2020 13.8  11.5 - 15.5 % Final   Platelets 11/15/2020 172  150 - 400 K/uL Final   nRBC 11/15/2020 0.0  0.0 - 0.2 % Final   Performed at [facility], [place] [address], [place], [place] [address]   Sodium 11/15/2020 138  135 - 145 mmol/L Final   Potassium 11/15/2020 4.5  3.5 - 5.1 mmol/L Final   Chloride 11/15/2020 105  98 - 111 mmol/L Final   CO2 11/15/2020 24  22 - 32 mmol/L Final   Glucose, Bld 11/15/2020 132 (A) 70 - 99 mg/dL Final   Glucose reference range applies only to samples taken after fasting for at least 8 hours.   BUN 11/15/2020 10  6 - 20 mg/dL Final   Creatinine, Ser 11/15/2020 0.82  0.44 - 1.00 mg/dL Final   Calcium 11/15/2020 8.9  8.9 - 10.3 mg/dL Final   GFR, Estimated 11/15/2020 >60  >60 mL/min Final   Comment: (NOTE) Calculated using the CKD-EPI Creatinine Equation (2021)    Anion gap 11/15/2020 9  5 - 15 Final   Performed at [facility], [address] [place], [place] [address]  Hospital Outpatient Visit on 11/01/2020  Component Date Value Ref Range Status   WBC 11/01/2020 5.0  4.0 - 10.5 K/uL Final   RBC 11/01/2020 4.37  3.87 - 5.11 MIL/uL Final   Hemoglobin 11/01/2020 13.1  12.0 - 15.0 g/dL Final   HCT 11/01/2020 40.7  36.0 - 46.0 % Final   MCV 11/01/2020 93.1  80.0 - 100.0 fL Final   MCH 11/01/2020 30.0  26.0 - 34.0 pg Final   MCHC 11/01/2020 32.2  30.0 - 36.0 g/dL Final   RDW 11/01/2020 13.8  11.5 - 15.5 % Final   Platelets  11/01/2020 189  150 - 400 K/uL Final   nRBC 11/01/2020 0.0  0.0 - 0.2 % Final   Performed at [facility], [place] [address], [place], [place] [address]   Sodium 11/01/2020 141  135 - 145 mmol/L Final   Potassium 11/01/2020 4.2  3.5 - 5.1 mmol/L Final   Chloride 11/01/2020 106  98 - 111 mmol/L Final   CO2 11/01/2020 25  22 - 32 mmol/L Final   Glucose, Bld 11/01/2020 103 (A) 70 - 99 mg/dL Final   Glucose reference range applies only to samples taken after fasting for at least 8 hours.   BUN 11/01/2020 17  6 - 20 mg/dL Final   Creatinine, Ser 11/01/2020 0.88  0.44 - 1.00 mg/dL Final   Calcium 11/01/2020 9.1  8.9 - 10.3 mg/dL Final   Total Protein 11/01/2020 7.3  6.5 - 8.1 g/dL Final   Albumin 11/01/2020 4.7  3.5 - 5.0 g/dL Final   AST 11/01/2020 20  15 - 41 U/L Final   ALT 11/01/2020 21  0 - 44 U/L Final   Alkaline Phosphatase 11/01/2020 68  38 - 126 U/L Final   Total Bilirubin 11/01/2020 0.7  0.3 - 1.2 mg/dL Final   GFR, Estimated 11/01/2020 >60  >60 mL/min Final   Comment: (NOTE) Calculated using the CKD-EPI Creatinine Equation (2021)    Anion gap 11/01/2020 10  5 - 15 Final   Performed at [facility], [place] [address], [place], [place] [address]   ABO/RH(D) 11/01/2020 AB POS   Final   Antibody Screen 11/01/2020 NEG   Final   Sample Expiration 11/01/2020 11/15/2020,2359   Final   Extend sample reason 11/01/2020    Final                   Value:NO TRANSFUSIONS OR PREGNANCY IN THE PAST 3 MONTHS Performed at [place] [address], [place], [place] [address]    Hgb A1c MFr Bld 11/01/2020 5.8 (A) 4.8 - 5.6 % Final   Comment: (NOTE) Pre diabetes:          5.7%-6.4%  Diabetes:              >6.4%  Glycemic control for   <7.0% adults with diabetes    Mean Plasma Glucose 11/01/2020 119.76  mg/dL Final   Performed at [facility], [place] [address] [place], [place] [address]   MRSA, PCR 11/01/2020 NEGATIVE  NEGATIVE Final    Staphylococcus  aureus 11/01/2020 NEGATIVE  NEGATIVE Final   Comment: (NOTE) The Xpert SA Assay (FDA approved for NASAL specimens in patients 50 years of age and older), is one component of a comprehensive surveillance program. It is not intended to diagnose infection nor to guide or monitor treatment. Performed at [facility], [place] [address], [place], [place] [address]      X-Rays:DG Pelvis Portable  Result Date: 11/14/2020 CLINICAL DATA:  Postoperative assessment following RIGHT hip arthroplasty. EXAM: PORTABLE PELVIS 1-2 VIEWS COMPARISON:  Intraoperative evaluation of November 14, 2020. FINDINGS: Low AP pelvis excludes the iliac crests and the upper sacrum, images extend through the lower aspect of the RIGHT hip arthroplasty femoral component. Post RIGHT hip arthroplasty with total hip arthroplasty. No immediate complications on AP view. Soft tissue gas about the RIGHT hip in the setting of recent postoperative change. Femoral and acetabular components appear well seated. Mild degenerative changes about the LEFT hip. IMPRESSION: Status post RIGHT hip arthroplasty without evidence of immediate complication in AP projection. Electronically Signed   By: Zetta Bills M.D.   On: 11/14/2020 14:31   DG C-Arm 1-60 Min-No Report  Result Date: 11/14/2020 Fluoroscopy was utilized by the requesting physician.  No radiographic interpretation.   DG HIP OPERATIVE UNILAT W OR W/O PELVIS RIGHT  Result Date: 11/14/2020 CLINICAL DATA:  Operative right anterior hip replacement. EXAM: OPERATIVE RIGHT HIP (WITH PELVIS IF PERFORMED) TECHNIQUE: Fluoroscopic spot image(s) were submitted for interpretation post-operatively. COMPARISON:  Preoperative imaging. FINDINGS: Four fluoroscopic spot views of the pelvis and right hip obtained in the operating room. Interval right hip arthroplasty with acetabular component and femoral stem. Fluoroscopy time 8 seconds. IMPRESSION: Procedural fluoroscopy for  right hip arthroplasty Electronically Signed   By: Keith Rake M.D.   On: 11/14/2020 15:06    EKG:No orders found for this or any previous visit.   Hospital Course: Brenda Allen is a 60 y.o. who was admitted to [facility]. They were brought to the operating room on 11/14/2020 and underwent Procedure(s): [place].  Patient tolerated the procedure well and was later transferred to the recovery room and then to the orthopaedic floor for postoperative care. They were given PO and IV analgesics for pain control following their surgery. They were given 24 hours of postoperative antibiotics of  Anti-infectives (From admission, onward)    Start     Dose/Rate Route Frequency Ordered Stop   11/14/20 1800  ceFAZolin (ANCEF) IVPB 2g/100 mL premix        2 g 200 mL/hr over 30 Minutes Intravenous Every 6 hours 11/14/20 1554 11/15/20 0014   11/14/20 1553  valACYclovir (VALTREX) tablet 1,000 mg  Status:  Discontinued        1,000 mg Oral 3 times daily PRN 11/14/20 1554 11/15/20 1953   11/14/20 0915  ceFAZolin (ANCEF) IVPB 2g/100 mL premix        2 g 200 mL/hr over 30 Minutes Intravenous On call to O.R. 11/14/20 [id number] 11/14/20 1146      and started on DVT prophylaxis in the form of Aspirin.   PT and OT were ordered for total joint protocol. Discharge planning consulted to help with postop disposition and equipment needs.  Patient had a good night on the evening of surgery. They started to get up OOB with therapy on POD #0. Pt was seen during rounds and was ready to go home pending progress with therapy.She worked with therapy on POD #1 and was meeting her  goals. Pt was discharged to home later that day in stable condition.  Diet: Regular diet Activity: WBAT Follow-up: in 2 weeks Disposition: Home Discharged Condition: good   Discharge Instructions     Call MD / Call 911   Complete by: As directed    If you experience chest pain or shortness of breath, CALL  911 and be transported to the hospital emergency room.  If you develope a fever above 101 F, pus (white drainage) or increased drainage or redness at the wound, or calf pain, call your surgeon's office.   Change dressing   Complete by: As directed    Maintain surgical dressing until follow up in the clinic. If the edges start to pull up, may reinforce with tape. If the dressing is no longer working, may remove and cover with gauze and tape, but must keep the area dry and clean.  Call with any questions or concerns.   Constipation Prevention   Complete by: As directed    Drink plenty of fluids.  Prune juice may be helpful.  You may use a stool softener, such as Colace (over the counter) 100 mg twice a day.  Use MiraLax (over the counter) for constipation as needed.   Diet - low sodium heart healthy   Complete by: As directed    Increase activity slowly as tolerated   Complete by: As directed    Weight bearing as tolerated with assist device (walker, cane, etc) as directed, use it as long as suggested by your surgeon or therapist, typically at least 4-6 weeks.   Post-operative opioid taper instructions:   Complete by: As directed    POST-OPERATIVE OPIOID TAPER INSTRUCTIONS: It is important to wean off of your opioid medication as soon as possible. If you do not need pain medication after your surgery it is ok to stop day one. Opioids include: Codeine, Hydrocodone(Norco, Vicodin), Oxycodone(Percocet, oxycontin) and hydromorphone amongst others.  Long term and even short term use of opiods can cause: Increased pain response Dependence Constipation Depression Respiratory depression And more.  Withdrawal symptoms can include Flu like symptoms Nausea, vomiting And more Techniques to manage these symptoms Hydrate well Eat regular healthy meals Stay active Use relaxation techniques(deep breathing, meditating, yoga) Do Not substitute Alcohol to help with tapering If you have been on opioids  for less than two weeks and do not have pain than it is ok to stop all together.  Plan to wean off of opioids This plan should start within one week post op of your joint replacement. Maintain the same interval or time between taking each dose and first decrease the dose.  Cut the total daily intake of opioids by one tablet each day Next start to increase the time between doses. The last dose that should be eliminated is the evening dose.      TED hose   Complete by: As directed    Use stockings (TED hose) for 2 weeks on both leg(s).  You may remove them at night for sleeping.      Allergies as of 11/15/2020   No Known Allergies      Medication List     STOP taking these medications    alendronate 70 MG tablet Commonly known as: FOSAMAX   diclofenac 75 MG EC tablet Commonly known as: VOLTAREN   ibuprofen 200 MG tablet Commonly known as: ADVIL   OVER THE COUNTER MEDICATION       TAKE these medications    benzonatate 200 MG  capsule Commonly known as: TESSALON Take 200 mg by mouth 3 (three) times daily as needed for cough.   Biotin 10000 MCG Tabs Take 10,000 mcg by mouth daily.   buPROPion 150 MG 24 hr tablet Commonly known as: WELLBUTRIN XL Take 150 mg by mouth daily.   CALCIUM PO Take 1,200 mg by mouth daily.   celecoxib 200 MG capsule Commonly known as: CELEBREX Take 1 capsule (200 mg total) by mouth 2 (two) times daily.   dexlansoprazole 60 MG capsule Commonly known as: Dexilant Take 1 capsule (60 mg total) by mouth daily. Please call [phone] to schedule an office visit for more refills   Dialyvite Vitamin D 5000 125 MCG (5000 UT) capsule Generic drug: Cholecalciferol Take 5,000 Units by mouth daily.   dicyclomine 10 MG capsule Commonly known as: BENTYL Take 1 capsule (10 mg total) by mouth in the morning and at bedtime. What changed:  when to take this reasons to take this   DIUREX PO Take 1 tablet by mouth daily.   loratadine 10 MG  tablet Commonly known as: CLARITIN Take 10 mg by mouth daily as needed for allergies.   Magnesium 400 MG Tabs Take 400 mg by mouth daily.   Melatonin 5 MG Caps Take 5 mg by mouth at bedtime.   promethazine-dextromethorphan 6.25-15 MG/5ML syrup Commonly known as: PROMETHAZINE-DM Take 5 mLs by mouth.   QC TUMERIC COMPLEX PO Take 1 tablet by mouth daily.   rOPINIRole 0.5 MG tablet Commonly known as: REQUIP Take 0.5 mg by mouth at bedtime as needed (restless legs).   rosuvastatin 10 MG tablet Commonly known as: CRESTOR Take 10 mg by mouth at bedtime.   SUPER COLLAGEN PLUS VITAMIN C PO Take 1 tablet by mouth daily.   valACYclovir 1000 MG tablet Commonly known as: VALTREX Take 1,000 mg by mouth 3 (three) times daily as needed (fever blisters).   Vicks DayQuil Cold & Flu 10-5-325 MG/15ML Liqd Generic drug: DM-Phenylephrine-Acetaminophen Take 15 mLs by mouth at bedtime as needed (cough).   vitamin B-12 1000 MCG tablet Commonly known as: CYANOCOBALAMIN Take 1,000 mcg by mouth daily.   VITAMIN C PO Take 0.5 tablets by mouth daily.   vitamin E 180 MG (400 UNITS) capsule Take 400 Units by mouth daily.               Discharge Care Instructions  (From admission, onward)           Start     Ordered   11/15/20 0000  Change dressing       Comments: Maintain surgical dressing until follow up in the clinic. If the edges start to pull up, may reinforce with tape. If the dressing is no longer working, may remove and cover with gauze and tape, but must keep the area dry and clean.  Call with any questions or concerns.   11/15/20 0813            Follow-up Information     Paralee Cancel, MD. Schedule an appointment as soon as possible for a visit in 2 week(s).   Specialty: Orthopedic Surgery Contact information: [address] [place] [address] [id number]                 Signed: Griffith Citron, PA-C Orthopedic Surgery 11/22/2020,  5:49 PM

## 2021-03-05 HISTORY — PX: REPLACEMENT TOTAL KNEE: SUR1224

## 2021-06-19 ENCOUNTER — Other Ambulatory Visit: Payer: Self-pay | Admitting: Gastroenterology

## 2021-08-31 ENCOUNTER — Other Ambulatory Visit: Payer: Self-pay

## 2021-08-31 DIAGNOSIS — E559 Vitamin D deficiency, unspecified: Secondary | ICD-10-CM | POA: Insufficient documentation

## 2021-08-31 DIAGNOSIS — R079 Chest pain, unspecified: Secondary | ICD-10-CM | POA: Insufficient documentation

## 2021-08-31 DIAGNOSIS — R7303 Prediabetes: Secondary | ICD-10-CM | POA: Insufficient documentation

## 2021-08-31 DIAGNOSIS — G2581 Restless legs syndrome: Secondary | ICD-10-CM | POA: Insufficient documentation

## 2021-08-31 DIAGNOSIS — M1712 Unilateral primary osteoarthritis, left knee: Secondary | ICD-10-CM | POA: Insufficient documentation

## 2021-08-31 DIAGNOSIS — Z86010 Personal history of colonic polyps: Secondary | ICD-10-CM | POA: Insufficient documentation

## 2021-08-31 DIAGNOSIS — T7840XA Allergy, unspecified, initial encounter: Secondary | ICD-10-CM | POA: Insufficient documentation

## 2021-08-31 DIAGNOSIS — M4302 Spondylolysis, cervical region: Secondary | ICD-10-CM | POA: Insufficient documentation

## 2021-08-31 DIAGNOSIS — M8580 Other specified disorders of bone density and structure, unspecified site: Secondary | ICD-10-CM | POA: Insufficient documentation

## 2021-08-31 DIAGNOSIS — F32A Depression, unspecified: Secondary | ICD-10-CM | POA: Insufficient documentation

## 2021-08-31 DIAGNOSIS — K219 Gastro-esophageal reflux disease without esophagitis: Secondary | ICD-10-CM | POA: Insufficient documentation

## 2021-08-31 DIAGNOSIS — N951 Menopausal and female climacteric states: Secondary | ICD-10-CM | POA: Insufficient documentation

## 2021-08-31 DIAGNOSIS — E785 Hyperlipidemia, unspecified: Secondary | ICD-10-CM | POA: Insufficient documentation

## 2021-08-31 DIAGNOSIS — R1310 Dysphagia, unspecified: Secondary | ICD-10-CM | POA: Insufficient documentation

## 2021-08-31 DIAGNOSIS — Z6836 Body mass index (BMI) 36.0-36.9, adult: Secondary | ICD-10-CM | POA: Insufficient documentation

## 2021-08-31 DIAGNOSIS — K2100 Gastro-esophageal reflux disease with esophagitis, without bleeding: Secondary | ICD-10-CM | POA: Insufficient documentation

## 2021-08-31 DIAGNOSIS — G5600 Carpal tunnel syndrome, unspecified upper limb: Secondary | ICD-10-CM | POA: Insufficient documentation

## 2021-08-31 DIAGNOSIS — M1990 Unspecified osteoarthritis, unspecified site: Secondary | ICD-10-CM | POA: Insufficient documentation

## 2021-08-31 DIAGNOSIS — M23305 Other meniscus derangements, unspecified medial meniscus, unspecified knee: Secondary | ICD-10-CM | POA: Insufficient documentation

## 2021-08-31 DIAGNOSIS — R5383 Other fatigue: Secondary | ICD-10-CM | POA: Insufficient documentation

## 2021-08-31 DIAGNOSIS — K5730 Diverticulosis of large intestine without perforation or abscess without bleeding: Secondary | ICD-10-CM | POA: Insufficient documentation

## 2021-08-31 DIAGNOSIS — Z8719 Personal history of other diseases of the digestive system: Secondary | ICD-10-CM | POA: Insufficient documentation

## 2021-08-31 DIAGNOSIS — J309 Allergic rhinitis, unspecified: Secondary | ICD-10-CM | POA: Insufficient documentation

## 2021-08-31 DIAGNOSIS — R7301 Impaired fasting glucose: Secondary | ICD-10-CM | POA: Insufficient documentation

## 2021-08-31 DIAGNOSIS — K5790 Diverticulosis of intestine, part unspecified, without perforation or abscess without bleeding: Secondary | ICD-10-CM | POA: Insufficient documentation

## 2021-08-31 DIAGNOSIS — K635 Polyp of colon: Secondary | ICD-10-CM | POA: Insufficient documentation

## 2021-09-04 DIAGNOSIS — Z96652 Presence of left artificial knee joint: Secondary | ICD-10-CM

## 2021-09-04 DIAGNOSIS — Z96651 Presence of right artificial knee joint: Secondary | ICD-10-CM

## 2021-09-04 HISTORY — DX: Presence of right artificial knee joint: Z96.651

## 2021-09-04 HISTORY — DX: Presence of left artificial knee joint: Z96.652

## 2021-09-05 ENCOUNTER — Encounter: Payer: Self-pay | Admitting: Cardiology

## 2021-09-05 ENCOUNTER — Ambulatory Visit (INDEPENDENT_AMBULATORY_CARE_PROVIDER_SITE_OTHER): Payer: No Typology Code available for payment source

## 2021-09-05 ENCOUNTER — Ambulatory Visit (INDEPENDENT_AMBULATORY_CARE_PROVIDER_SITE_OTHER): Payer: No Typology Code available for payment source | Admitting: Cardiology

## 2021-09-05 VITALS — BP 146/86 | HR 60 | Ht 64.0 in | Wt 214.2 lb

## 2021-09-05 DIAGNOSIS — R002 Palpitations: Secondary | ICD-10-CM | POA: Insufficient documentation

## 2021-09-05 DIAGNOSIS — E785 Hyperlipidemia, unspecified: Secondary | ICD-10-CM

## 2021-09-05 DIAGNOSIS — Z6836 Body mass index (BMI) 36.0-36.9, adult: Secondary | ICD-10-CM | POA: Diagnosis not present

## 2021-09-05 DIAGNOSIS — R079 Chest pain, unspecified: Secondary | ICD-10-CM

## 2021-09-05 DIAGNOSIS — R011 Cardiac murmur, unspecified: Secondary | ICD-10-CM

## 2021-09-05 DIAGNOSIS — R072 Precordial pain: Secondary | ICD-10-CM

## 2021-09-05 HISTORY — DX: Chest pain, unspecified: R07.9

## 2021-09-05 HISTORY — DX: Palpitations: R00.2

## 2021-09-05 HISTORY — DX: Cardiac murmur, unspecified: R01.1

## 2021-09-05 MED ORDER — NITROGLYCERIN 0.4 MG SL SUBL: 0.4000 mg | SUBLINGUAL_TABLET | SUBLINGUAL | 3 refills | Status: DC | PRN

## 2021-09-05 NOTE — Addendum Note (Signed)
Addended by: Jerl Santos R on: 09/05/2021 10:26 AM   Modules accepted: Orders

## 2021-09-05 NOTE — Patient Instructions (Addendum)
Medication Instructions:  Your physician has recommended you make the following change in your medication:  Nitroglycerin 0.4 mg sublingual (under your tongue) as needed for chest pain. If experiencing chest pain, stop what you are doing and sit down. Take 1 nitroglycerin and wait 5 minutes. If chest pain continues, take another nitroglycerin and wait 5 minutes. If chest pain does not subside, take 1 more nitroglycerin and dial 911. You make take a total of 3 nitroglycerin in a 15 minute time frame.  *If you need a refill on your cardiac medications before your next appointment, please call your pharmacy*   Lab Work: NONE If you have labs (blood work) drawn today and your tests are completely normal, you will receive your results only by: [place] (if you have MyChart) OR A paper copy in the mail If you have any lab test that is abnormal or we need to change your treatment, we will call you to review the results.   Testing/Procedures: Your physician has requested that you have an echocardiogram. Echocardiography is a painless test that uses sound waves to create images of your heart. It provides your doctor with information about the size and shape of your heart and how well your heart's chambers and valves are working. This procedure takes approximately one hour. There are no restrictions for this procedure.   Your physician has requested that you have a lexiscan myoview. For further information please visit [email or web address]. Please follow instruction sheet, as given.   The test will take approximately 3 to 4 hours to complete; you may bring reading material.  If someone comes with you to your appointment, they will need to remain in the main lobby due to limited space in the testing area. **If you are pregnant or breastfeeding, please notify the nuclear lab prior to your appointment**  How to prepare for your Myocardial Perfusion Test: Do not eat or drink 3 hours prior to your  test, except you may have water. Do not consume products containing caffeine (regular or decaffeinated) 12 hours prior to your test. (ex: coffee, chocolate, sodas, tea). Do bring a list of your current medications with you.  If not listed below, you may take your medications as normal. Do wear comfortable clothes (no dresses or overalls) and walking shoes, tennis shoes preferred (No heels or open toe shoes are allowed). Do NOT wear cologne, perfume, aftershave, or lotions (deodorant is allowed). If these instructions are not followed, your test will have to be rescheduled.  You have been asked to wear a Zio Heart Monitor today. It is to be worn for  14   days. Please remove the monitor on  09/19/21      and mail back in the box provided.  If you have any questions about the monitor please call the company at [phone]      Follow-Up: At [facility], you and your health needs are our priority.  As part of our continuing mission to provide you with exceptional heart care, we have created designated Provider Care Teams.  These Care Teams include your primary Cardiologist (physician) and Advanced Practice Providers (APPs -  Physician Assistants and Nurse Practitioners) who all work together to provide you with the care you need, when you need it.  We recommend signing up for the patient portal called "MyChart".  Sign up information is provided on this After Visit Summary.  MyChart is used to connect with patients for Virtual Visits (Telemedicine).  Patients are able to view lab/test  results, encounter notes, upcoming appointments, etc.  Non-urgent messages can be sent to your provider as well.   To learn more about what you can do with MyChart, go to [email or web address].    Your next appointment:   6 month(s)  The format for your next appointment:   In Person  Provider:   Jyl Heinz, MD    Other Instructions   Important Information About Sugar

## 2021-09-05 NOTE — Addendum Note (Signed)
Addended by: Jerl Santos R on: 09/05/2021 11:50 AM   Modules accepted: Orders
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
## 2021-09-06 ENCOUNTER — Telehealth (HOSPITAL_COMMUNITY): Payer: Self-pay | Admitting: *Deleted

## 2021-09-06 NOTE — Telephone Encounter (Signed)
Patient given detailed instructions per Myocardial Perfusion Study Information Sheet for the test on 09/12/2021 at 8:30. Patient notified to arrive 15 minutes early and that it is imperative to arrive on time for appointment to keep from having the test rescheduled.  If you need to cancel or reschedule your appointment, please call the office within 24 hours of your appointment. . Patient verbalized understanding.Brenda Allen

## 2021-09-12 ENCOUNTER — Ambulatory Visit (INDEPENDENT_AMBULATORY_CARE_PROVIDER_SITE_OTHER): Payer: No Typology Code available for payment source

## 2021-09-12 DIAGNOSIS — R011 Cardiac murmur, unspecified: Secondary | ICD-10-CM

## 2021-09-12 DIAGNOSIS — R079 Chest pain, unspecified: Secondary | ICD-10-CM | POA: Diagnosis not present

## 2021-09-12 DIAGNOSIS — R072 Precordial pain: Secondary | ICD-10-CM

## 2021-09-12 LAB — ECHOCARDIOGRAM COMPLETE
Area-P 1/2: 2.93 cm2
MV M vel: 4.33 m/s
MV Peak grad: 75 mmHg
Radius: 0.3 cm
S' Lateral: 4.1 cm

## 2021-09-19 ENCOUNTER — Ambulatory Visit: Payer: No Typology Code available for payment source | Admitting: Gastroenterology

## 2021-09-27 ENCOUNTER — Other Ambulatory Visit: Payer: Self-pay | Admitting: Gastroenterology

## 2021-10-05 NOTE — Addendum Note (Signed)
Addended by: Truddie Hidden on: 10/05/2021 01:09 PM   Modules accepted: Orders

## 2021-10-08 ENCOUNTER — Telehealth: Payer: Self-pay | Admitting: Gastroenterology

## 2021-10-08 MED ORDER — DEXLANSOPRAZOLE 60 MG PO CPDR: 1.0000 | DELAYED_RELEASE_CAPSULE | Freq: Every day | ORAL | 4 refills | Status: DC

## 2021-10-08 NOTE — Telephone Encounter (Signed)
Patient is aware. [facility] per [facility] request

## 2021-10-08 NOTE — Telephone Encounter (Signed)
Patient called requesting a refill for her generic Dexilant.  She said [facility] has tried to contact us but we haven't responded to them.  Patient says she is almost out and needs the refill asap.  She has an OV appt with Dr. Lyndel Safe on 7/19.  Please call patient and let her know once it's been [facility] into [facility], or call her if there is an issue with this.  Thank you.

## 2021-10-09 MED ORDER — DEXLANSOPRAZOLE 60 MG PO CPDR: 1.0000 | DELAYED_RELEASE_CAPSULE | Freq: Every day | ORAL | 4 refills | Status: DC

## 2021-10-09 NOTE — Telephone Encounter (Addendum)
LVM for patient to call back. Needs to know what other PPI pt has taken in the past since Dexilant (taken since 05-2014) isnt on her formulary  Per [facility] medication isn't on formulary. Patient has to tried and fail Protonix Prilosec (2011-2016), Nexium and  Prevacid before an PA can be completed. Number to initiate call is [phone]

## 2021-10-09 NOTE — Addendum Note (Signed)
Addended by: Curlene Labrum E on: 10/09/2021 08:29 AM   Modules accepted: Orders

## 2021-10-09 NOTE — Telephone Encounter (Signed)
[facility] is [facility]. Will try after 9. Will try to send script in again

## 2021-10-09 NOTE — Telephone Encounter (Signed)
PA sent through cover my meds 

## 2021-10-15 MED ORDER — DEXLANSOPRAZOLE 60 MG PO CPDR: 1.0000 | DELAYED_RELEASE_CAPSULE | Freq: Every day | ORAL | 4 refills | Status: DC

## 2021-10-15 NOTE — Telephone Encounter (Signed)
Spoke to [place] and she said that the claim went through and it is an in process claim and that it being processed. Patient is enrolled in the messaging ap. Patient is aware.

## 2021-10-15 NOTE — Addendum Note (Signed)
Addended by: Curlene Labrum E on: 10/15/2021 08:59 AM   Modules accepted: Orders

## 2021-10-15 NOTE — Telephone Encounter (Addendum)
PA approved from 10-13-2021 to 10-14-2022

## 2021-10-17 ENCOUNTER — Encounter: Payer: Self-pay | Admitting: Gastroenterology

## 2021-10-17 ENCOUNTER — Ambulatory Visit: Payer: No Typology Code available for payment source | Admitting: Gastroenterology

## 2021-10-17 ENCOUNTER — Telehealth: Payer: Self-pay | Admitting: Gastroenterology

## 2021-10-17 VITALS — BP 122/82 | HR 55 | Ht 64.0 in | Wt 209.0 lb

## 2021-10-17 DIAGNOSIS — R0789 Other chest pain: Secondary | ICD-10-CM | POA: Diagnosis not present

## 2021-10-17 DIAGNOSIS — Z86010 Personal history of colonic polyps: Secondary | ICD-10-CM

## 2021-10-17 DIAGNOSIS — R1013 Epigastric pain: Secondary | ICD-10-CM

## 2021-10-17 DIAGNOSIS — K580 Irritable bowel syndrome with diarrhea: Secondary | ICD-10-CM

## 2021-10-17 MED ORDER — DICYCLOMINE HCL 10 MG PO CAPS: 10.0000 mg | ORAL_CAPSULE | Freq: Four times a day (QID) | ORAL | 0 refills | Status: DC | PRN
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
## 2021-10-17 NOTE — Telephone Encounter (Signed)
Patient saw Dr. Lyndel Safe today and he scheduled her for an ultrasound at [facility] for Friday.  Her insurance will not cover it, so she was asking if there was an outpatient facility she could go to.  Please call patient and advise.  Thank you.

## 2021-10-17 NOTE — Telephone Encounter (Signed)
Patient given CPT code [phone] and she will call the insurance company about this and will call me back regarding her [place] and where it needs to scheduled

## 2021-10-17 NOTE — Patient Instructions (Signed)
If you are age 62 or older, your body mass index should be between 23-30. Your Body mass index is 35.87 kg/m. If this is out of the aforementioned range listed, please consider follow up with your Primary Care Provider.  If you are age 62 or younger, your body mass index should be between 19-25. Your Body mass index is 35.87 kg/m. If this is out of the aformentioned range listed, please consider follow up with your Primary Care Provider.   ________________________________________________________  The [facility] providers would like to encourage you to use [facility] to communicate with providers for non-urgent requests or questions.  Due to long hold times on the telephone, sending your provider a message by [facility] may be a faster and more efficient way to get a response.  Please allow 48 business hours for a response.  Please remember that this is for non-urgent requests.  _______________________________________________________  Follow up with insurance regarding your dexilant.  Stop Magnesium and Duirex.  We have sent the following medications to your pharmacy for you to pick up at your convenience: Bentyl  You have been scheduled for an abdominal ultrasound at [facility] (1st floor of hospital) on 10-19-2021 at 930am. Please arrive 30 minutes prior to your appointment for registration. Make certain not to have anything to eat or drink midnight prior to your appointment. Should you need to reschedule your appointment, please contact radiology at [phone]. This test typically takes about 30 minutes to perform.  Thank you,  Dr. Jackquline Denmark

## 2021-10-18 NOTE — Telephone Encounter (Signed)
Patient did echeck in on 7-21 [place] appointment at [facility] already. She didn't call back to let us know if we needed to change locations

## 2021-10-19 ENCOUNTER — Ambulatory Visit (HOSPITAL_COMMUNITY)
Admission: RE | Admit: 2021-10-19 | Discharge: 2021-10-19 | Disposition: A | Discharge status: Home / Self Care | Payer: No Typology Code available for payment source | Source: Ambulatory Visit | Attending: Gastroenterology | Admitting: Gastroenterology

## 2021-10-19 DIAGNOSIS — R1013 Epigastric pain: Secondary | ICD-10-CM | POA: Diagnosis not present

## 2021-10-19 DIAGNOSIS — R0789 Other chest pain: Secondary | ICD-10-CM | POA: Diagnosis present

## 2021-10-19 DIAGNOSIS — K580 Irritable bowel syndrome with diarrhea: Secondary | ICD-10-CM | POA: Diagnosis present

## 2021-10-22 DIAGNOSIS — R079 Chest pain, unspecified: Secondary | ICD-10-CM | POA: Diagnosis not present

## 2022-04-03 ENCOUNTER — Other Ambulatory Visit: Payer: Self-pay

## 2022-04-04 ENCOUNTER — Encounter: Payer: Self-pay | Admitting: Cardiology

## 2022-04-04 ENCOUNTER — Ambulatory Visit: Payer: No Typology Code available for payment source | Attending: Cardiology | Admitting: Cardiology

## 2022-04-04 VITALS — BP 130/76 | HR 64 | Ht 64.0 in | Wt 217.0 lb

## 2022-04-04 DIAGNOSIS — E782 Mixed hyperlipidemia: Secondary | ICD-10-CM

## 2022-04-04 DIAGNOSIS — E669 Obesity, unspecified: Secondary | ICD-10-CM

## 2022-04-04 HISTORY — DX: Obesity, unspecified: E66.9

## 2022-04-04 NOTE — Patient Instructions (Signed)
Medication Instructions:  Your physician recommends that you continue on your current medications as directed. Please refer to the Current Medication list given to you today.  *If you need a refill on your cardiac medications before your next appointment, please call your pharmacy*   Lab Work: None ordered If you have labs (blood work) drawn today and your tests are completely normal, you will receive your results only by: MyChart Message (if you have MyChart) OR A paper copy in the mail If you have any lab test that is abnormal or we need to change your treatment, we will call you to review the results.   Testing/Procedures: None ordered   Follow-Up: At [facility], you and your health needs are our priority.  As part of our continuing mission to provide you with exceptional heart care, we have created designated Provider Care Teams.  These Care Teams include your primary Cardiologist (physician) and Advanced Practice Providers (APPs -  Physician Assistants and Nurse Practitioners) who all work together to provide you with the care you need, when you need it.  We recommend signing up for the patient portal called "MyChart".  Sign up information is provided on this After Visit Summary.  MyChart is used to connect with patients for Virtual Visits (Telemedicine).  Patients are able to view lab/test results, encounter notes, upcoming appointments, etc.  Non-urgent messages can be sent to your provider as well.   To learn more about what you can do with MyChart, go to [email or web address].    Your next appointment:   9 month(s)  The format for your next appointment:   In Person  Provider:   Rajan Revankar, MD    Other Instructions none  Important Information About Sugar       
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
## 2022-04-27 ENCOUNTER — Emergency Department (HOSPITAL_BASED_OUTPATIENT_CLINIC_OR_DEPARTMENT_OTHER)
Admission: EM | Admit: 2022-04-27 | Discharge: 2022-04-27 | Disposition: A | Discharge status: Home / Self Care | Payer: Managed Care, Other (non HMO) | Attending: Emergency Medicine | Admitting: Emergency Medicine

## 2022-04-27 ENCOUNTER — Other Ambulatory Visit: Payer: Self-pay

## 2022-04-27 ENCOUNTER — Emergency Department (HOSPITAL_BASED_OUTPATIENT_CLINIC_OR_DEPARTMENT_OTHER): Payer: Managed Care, Other (non HMO)

## 2022-04-27 DIAGNOSIS — E876 Hypokalemia: Secondary | ICD-10-CM

## 2022-04-27 DIAGNOSIS — R002 Palpitations: Secondary | ICD-10-CM

## 2022-04-27 LAB — BASIC METABOLIC PANEL
Anion gap: 6 (ref 5–15)
BUN: 13 mg/dL (ref 8–23)
CO2: 24 mmol/L (ref 22–32)
Calcium: 8.5 mg/dL — ABNORMAL LOW (ref 8.9–10.3)
Chloride: 105 mmol/L (ref 98–111)
Creatinine, Ser: 1.02 mg/dL — ABNORMAL HIGH (ref 0.44–1.00)
GFR, Estimated: >60 mL/min (ref >60)
Glucose, Bld: 103 mg/dL — ABNORMAL HIGH (ref 70–99)
Potassium: 3.2 mmol/L — ABNORMAL LOW (ref 3.5–5.1)
Sodium: 135 mmol/L (ref 135–145)

## 2022-04-27 LAB — TROPONIN I (HIGH SENSITIVITY)
Troponin I (High Sensitivity): 4 ng/L (ref <18)
Troponin I (High Sensitivity): 4 ng/L (ref <18)

## 2022-04-27 LAB — CBC
HCT: 40 % (ref 36.0–46.0)
Hemoglobin: 13.1 g/dL (ref 12.0–15.0)
MCH: 29.4 pg (ref 26.0–34.0)
MCHC: 32.8 g/dL (ref 30.0–36.0)
MCV: 89.9 fL (ref 80.0–100.0)
Platelets: 228 10*3/uL (ref 150–400)
RBC: 4.45 MIL/uL (ref 3.87–5.11)
RDW: 14.1 % (ref 11.5–15.5)
WBC: 7.1 10*3/uL (ref 4.0–10.5)
nRBC: 0 % (ref 0.0–0.2)

## 2022-04-27 MED ORDER — POTASSIUM CHLORIDE CRYS ER 20 MEQ PO TBCR
40.0000 meq | EXTENDED_RELEASE_TABLET | Freq: Once | ORAL | Status: AC
  Administered 2022-04-27: 40 meq via ORAL
  Filled 2022-04-27: qty 2

## 2022-04-27 MED ORDER — POTASSIUM CHLORIDE CRYS ER 20 MEQ PO TBCR: 20.0000 meq | EXTENDED_RELEASE_TABLET | Freq: Two times a day (BID) | ORAL | 0 refills | Status: DC

## 2022-04-27 NOTE — ED Triage Notes (Signed)
Patient presents to ED via POV from home. Here with palpitations that began Monday. Denies pain. Reports similar episode 6 months ago. Patient saw a cardiologist at that time and had a negative workup.

## 2022-04-27 NOTE — Discharge Instructions (Addendum)
Make an appointment follow-up with your cardiologist.  Return for rapid heart rate lasting 40 minutes or longer for any new or worse symptoms.  Take the oral potassium as directed.  Based on your story there could be a history of atrial fibrillation but rhythms here have been good.

## 2022-04-27 NOTE — ED Provider Notes (Signed)
[place] [facility] Provider Note   CSN: [id number] Arrival date & time: 04/27/22  1417     History  Chief Complaint  Patient presents with   Palpitations    Brenda Allen is a 62 y.o. female.  Patient presenting with the complaints of some palpitations that been ongoing since Monday.  6 months ago patient was evaluated by cardiology in the [facility] area had cardiac monitoring done at home without any significant arrhythmias.  Patient has been using a Kardia device at home and it raised up some questions of possible atrial fibs.  But not definitive.  Patient cardiac monitoring here without any arrhythmias at all.  Past medical history is significant for dysphagia gastroesophageal reflux disease prediabetes hyperlipidemia diverticulosis cardiac murmur and June 2023.  And chest pain of uncertain etiology June 2023.  Patient without any shortness of breath no chest pain.  Patient was a previous smoker.         Home Medications Prior to Admission medications   Medication Sig Start Date End Date Taking? Authorizing Provider  potassium chloride SA (KLOR-CON M) 20 MEQ tablet Take 1 tablet (20 mEq total) by mouth 2 (two) times daily. 04/27/22  Yes Fredia Sorrow, MD  alendronate (FOSAMAX) 70 MG tablet Take 70 mg by mouth once a week. 06/19/21   [provider]  Ascorbic Acid (VITAMIN C PO) Take 0.5 tablets by mouth daily.    [provider]  Biotin 10000 MCG TABS Take 10,000 mcg by mouth daily.    [provider]  buPROPion (WELLBUTRIN XL) 300 MG 24 hr tablet Take 300 mg by mouth daily. 03/12/22   [provider]  Caffeine-Magnesium Salicylate (DIUREX PO) Take 1 tablet by mouth daily.    [provider]  Calcium Carbonate-Vit D-Min (CALCIUM 600+D PLUS MINERALS) 600-400 MG-UNIT TABS Take 1 tablet by mouth in the morning and at bedtime.    [provider]  Cholecalciferol (DIALYVITE VITAMIN D 5000) 125  MCG (5000 UT) capsule Take 5,000 Units by mouth daily.    [provider]  Cyanocobalamin (B-12) 1000 MCG CAPS Take 1,000 mcg by mouth daily. 09/04/21   [provider]  dexlansoprazole (DEXILANT) 60 MG capsule Take 1 capsule (60 mg total) by mouth daily. 10/15/21   Jackquline Denmark, MD  diclofenac (VOLTAREN) 75 MG EC tablet Take 75 mg by mouth daily as needed for mild pain or moderate pain.    [provider]  dicyclomine (BENTYL) 10 MG capsule Take 1 capsule (10 mg total) by mouth 4 (four) times daily as needed for spasms. 10/17/21   Jackquline Denmark, MD  Magnesium 250 MG TABS Take 250 mg by mouth daily.    [provider]  nitroGLYCERIN (NITROSTAT) 0.4 MG SL tablet Place 0.4 mg under the tongue every 5 (five) minutes as needed for chest pain.    [provider]  rOPINIRole (REQUIP) 0.5 MG tablet Take 0.5 mg by mouth at bedtime as needed (restless legs).    [provider]  rosuvastatin (CRESTOR) 10 MG tablet Take 10 mg by mouth at bedtime. 06/21/20   [provider]  Turmeric (QC TUMERIC COMPLEX PO) Take 1 tablet by mouth daily.    [provider]  valACYclovir (VALTREX) 1000 MG tablet Take 1,000 mg by mouth as needed (fever blisters). 05/30/20   [provider]      Allergies    Cat hair extract, Dust mite extract, and Tree extract    Review  of Systems   Review of Systems  Constitutional:  Negative for chills and fever.  HENT:  Negative for ear pain and sore throat.   Eyes:  Negative for pain and visual disturbance.  Respiratory:  Negative for cough and shortness of breath.   Cardiovascular:  Positive for palpitations. Negative for chest pain.  Gastrointestinal:  Negative for abdominal pain and vomiting.  Genitourinary:  Negative for dysuria and hematuria.  Musculoskeletal:  Negative for arthralgias and back pain.  Skin:  Negative for color change and rash.  Neurological:  Negative for seizures and syncope.  All other  systems reviewed and are negative.   Physical Exam Updated Vital Signs BP 128/88   Pulse 68   Temp 98.1 F (36.7 C) (Oral)   Resp 19   Ht 1.626 m ('5\' 4"'$ )   Wt 95.7 kg   SpO2 98%   BMI 36.22 kg/m  Physical Exam Vitals and nursing note reviewed.  Constitutional:      General: She is not in acute distress.    Appearance: Normal appearance. She is well-developed.  HENT:     Head: Normocephalic and atraumatic.  Eyes:     Extraocular Movements: Extraocular movements intact.     Conjunctiva/sclera: Conjunctivae normal.     Pupils: Pupils are equal, round, and reactive to light.  Cardiovascular:     Rate and Rhythm: Normal rate and regular rhythm.     Heart sounds: No murmur heard. Pulmonary:     Effort: Pulmonary effort is normal. No respiratory distress.     Breath sounds: Normal breath sounds.  Abdominal:     Palpations: Abdomen is soft.     Tenderness: There is no abdominal tenderness.  Musculoskeletal:        General: No swelling.     Cervical back: Normal range of motion and neck supple.  Skin:    General: Skin is warm and dry.     Capillary Refill: Capillary refill takes less than 2 seconds.  Neurological:     General: No focal deficit present.     Mental Status: She is alert and oriented to person, place, and time.     Cranial Nerves: No cranial nerve deficit.     Sensory: No sensory deficit.     Motor: No weakness.  Psychiatric:        Mood and Affect: Mood normal.     ED Results / Procedures / Treatments   Labs (all labs ordered are listed, but only abnormal results are displayed) Labs Reviewed  BASIC METABOLIC PANEL - Abnormal; Notable for the following components:      Result Value   Potassium 3.2 (*)    Glucose, Bld 103 (*)    Creatinine, Ser 1.02 (*)    Calcium 8.5 (*)    All other components within normal limits  CBC  TROPONIN I (HIGH SENSITIVITY)  TROPONIN I (HIGH SENSITIVITY)    EKG EKG Interpretation  Date/Time:  Saturday April 27 2022 14:27:47 EST Ventricular Rate:  73 PR Interval:  158 QRS Duration: 105 QT Interval:  400 QTC Calculation: 441 R Axis:   -1 Text Interpretation: Sinus rhythm Ventricular trigeminy Low voltage, precordial leads Borderline T abnormalities, inferior leads No previous ECGs available Confirmed by Fredia Sorrow [id number]) on 04/27/2022 9:29:17 PM  Radiology DG Chest 2 View  Result Date: 04/27/2022 CLINICAL DATA:  Chest pain x1 week intermittent. EXAM: CHEST - 2 VIEW COMPARISON:  CTA chest April 30, 2011. FINDINGS: Mild cardiac enlargement. No focal consolidation.  No pleural effusion. No pneumothorax. Thoracic spondylosis. IMPRESSION: Mild cardiac enlargement, no overt pulmonary edema or focal airspace consolidation. Electronically Signed   By: Dahlia Bailiff M.D.   On: 04/27/2022 14:56    Procedures Procedures    Medications Ordered in ED Medications  potassium chloride SA (KLOR-CON M) CR tablet 40 mEq (has no administration in time range)    ED Course/ Medical Decision Making/ A&P                             Medical Decision Making Amount and/or Complexity of Data Reviewed Labs: ordered. Radiology: ordered.  Risk Prescription drug management.   Patient's potassium little bit low here.  Testing was 3.2.  Creatinine up a little bit but GFR was greater than 60.  CBC normal white blood cell count hemoglobin 13.1 platelets normal.  Cardiac monitoring here with occasional PVCs but no evidence of atrial fib or flutter.  No tachycardia.  EKG showed some evidence of ventricular trigeminy.  We did not see significant trigeminy on the cardiac monitor.  For the hypokalemia patient given 40 [place] potassium here.  And patient will be discharged home with 20 mill equivalents potassium twice daily for 2 days.  Patient has been seen by cardiology in [facility] again they will call for follow-up.  Patient probably needs home monitoring again.  Patient given precautions to return for rapid  heart rate lasting 40 minutes or longer for any new or worse symptoms.   Final Clinical Impression(s) / ED Diagnoses Final diagnoses:  Heart palpitations  Hypokalemia    Rx / DC Orders ED Discharge Orders          Ordered    potassium chloride SA (KLOR-CON M) 20 MEQ tablet  2 times daily        04/27/22 2252              Fredia Sorrow, MD 04/27/22 2304

## 2022-05-02 ENCOUNTER — Other Ambulatory Visit: Payer: Self-pay

## 2022-05-02 ENCOUNTER — Ambulatory Visit: Payer: No Typology Code available for payment source | Attending: Cardiology | Admitting: Cardiology

## 2022-05-02 ENCOUNTER — Encounter: Payer: Self-pay | Admitting: Cardiology

## 2022-05-02 VITALS — BP 142/82 | HR 108 | Ht 64.0 in | Wt 213.0 lb

## 2022-05-02 DIAGNOSIS — R002 Palpitations: Secondary | ICD-10-CM | POA: Diagnosis not present

## 2022-05-02 DIAGNOSIS — Z6836 Body mass index (BMI) 36.0-36.9, adult: Secondary | ICD-10-CM | POA: Diagnosis not present

## 2022-05-02 DIAGNOSIS — Z86010 Personal history of colonic polyps: Secondary | ICD-10-CM

## 2022-05-02 DIAGNOSIS — E669 Obesity, unspecified: Secondary | ICD-10-CM | POA: Diagnosis not present

## 2022-05-02 MED ORDER — METOPROLOL TARTRATE 25 MG PO TABS: 12.5000 mg | ORAL_TABLET | ORAL | 1 refills | Status: DC | PRN

## 2022-05-02 MED ORDER — DICYCLOMINE HCL 10 MG PO CAPS: 10.0000 mg | ORAL_CAPSULE | Freq: Four times a day (QID) | ORAL | 2 refills | Status: DC | PRN
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
## 2022-05-02 NOTE — Patient Instructions (Signed)
Medication Instructions:  Your physician has recommended you make the following change in your medication:   START: Metoprolol tartrate 25 mg as needed for palpitations  *If you need a refill on your cardiac medications before your next appointment, please call your pharmacy*   Lab Work: Your physician recommends that you return for lab work in:   Labs today: TSH  If you have labs (blood work) drawn today and your tests are completely normal, you will receive your results only by: [place] (if you have MyChart) OR A paper copy in the mail If you have any lab test that is abnormal or we need to change your treatment, we will call you to review the results.   Testing/Procedures: 30 day [employer] event monitor.   Follow-Up: At [facility], you and your health needs are our priority.  As part of our continuing mission to provide you with exceptional heart care, we have created designated Provider Care Teams.  These Care Teams include your primary Cardiologist (physician) and Advanced Practice Providers (APPs -  Physician Assistants and Nurse Practitioners) who all work together to provide you with the care you need, when you need it.  We recommend signing up for the patient portal called "MyChart".  Sign up information is provided on this After Visit Summary.  MyChart is used to connect with patients for Virtual Visits (Telemedicine).  Patients are able to view lab/test results, encounter notes, upcoming appointments, etc.  Non-urgent messages can be sent to your provider as well.   To learn more about what you can do with MyChart, go to [email or web address].    Your next appointment:   1 month(s)  Provider:   Jyl Heinz, MD    Other Instructions None

## 2022-05-03 LAB — TSH: TSH: 1.1 u[IU]/mL (ref 0.450–4.500)

## 2022-05-22 ENCOUNTER — Telehealth: Payer: Self-pay | Admitting: Cardiology

## 2022-05-22 NOTE — Telephone Encounter (Signed)
Patient calling to schedule heart monitor appointment.

## 2022-05-23 ENCOUNTER — Ambulatory Visit: Payer: No Typology Code available for payment source | Admitting: Gastroenterology

## 2022-05-23 NOTE — Telephone Encounter (Signed)
Left vm for pt to callback 

## 2022-05-23 NOTE — Telephone Encounter (Signed)
Pt aware that we are currently out of 30 day monitors and that we will call once we get one in the office.

## 2022-05-23 NOTE — Telephone Encounter (Signed)
Patient returned call

## 2022-05-23 NOTE — Telephone Encounter (Signed)
Pt needs a 30 day monitor for palpations.

## 2022-05-24 ENCOUNTER — Ambulatory Visit: Payer: No Typology Code available for payment source | Attending: Cardiology

## 2022-05-24 DIAGNOSIS — Z6836 Body mass index (BMI) 36.0-36.9, adult: Secondary | ICD-10-CM

## 2022-05-24 DIAGNOSIS — R002 Palpitations: Secondary | ICD-10-CM

## 2022-05-24 DIAGNOSIS — E669 Obesity, unspecified: Secondary | ICD-10-CM | POA: Diagnosis not present

## 2022-05-28 ENCOUNTER — Ambulatory Visit: Payer: No Typology Code available for payment source | Admitting: Cardiology

## 2022-06-03 ENCOUNTER — Encounter: Payer: Self-pay | Admitting: Cardiology

## 2022-06-03 ENCOUNTER — Ambulatory Visit: Payer: No Typology Code available for payment source | Attending: Cardiology | Admitting: Cardiology

## 2022-06-03 VITALS — BP 130/80 | HR 62 | Ht 64.0 in | Wt 217.6 lb

## 2022-06-03 DIAGNOSIS — E669 Obesity, unspecified: Secondary | ICD-10-CM

## 2022-06-03 DIAGNOSIS — R002 Palpitations: Secondary | ICD-10-CM | POA: Diagnosis not present

## 2022-06-03 NOTE — Patient Instructions (Signed)
Medication Instructions:  Your physician recommends that you continue on your current medications as directed. Please refer to the Current Medication list given to you today.  *If you need a refill on your cardiac medications before your next appointment, please call your pharmacy*   Lab Work: None ordered If you have labs (blood work) drawn today and your tests are completely normal, you will receive your results only by: [place] (if you have MyChart) OR A paper copy in the mail If you have any lab test that is abnormal or we need to change your treatment, we will call you to review the results.   Testing/Procedures: None ordered   Follow-Up: At [facility], you and your health needs are our priority.  As part of our continuing mission to provide you with exceptional heart care, we have created designated Provider Care Teams.  These Care Teams include your primary Cardiologist (physician) and Advanced Practice Providers (APPs -  Physician Assistants and Nurse Practitioners) who all work together to provide you with the care you need, when you need it.  We recommend signing up for the patient portal called "MyChart".  Sign up information is provided on this After Visit Summary.  MyChart is used to connect with patients for Virtual Visits (Telemedicine).  Patients are able to view lab/test results, encounter notes, upcoming appointments, etc.  Non-urgent messages can be sent to your provider as well.   To learn more about what you can do with MyChart, go to [email or web address].    Your next appointment:   3 month(s)  The format for your next appointment:   In Person  Provider:   Jyl Heinz, MD    Other Instructions none  Important Information About Sugar
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
## 2022-06-06 ENCOUNTER — Telehealth: Payer: Self-pay | Admitting: *Deleted

## 2022-06-06 NOTE — Telephone Encounter (Signed)
Phoned pt to verify that she is wearing the 30 day event monitor due to fax received saying pt not being monitored. Spoke with pt and she verified that she is being monitored according to her phone. Thanked pt and ended call.

## 2022-06-18 NOTE — Telephone Encounter (Signed)
Spoke with pt after transmission report from [employer] saying they had no transmissions for 2 days. Pt states that she had blisters from the monitor and that the company was sending new stickers and she would reapply once the blisters were cleared. Pt states that the company advised her they would only extend the wear for 5 days.

## 2022-07-05 ENCOUNTER — Telehealth: Payer: Self-pay

## 2022-07-05 DIAGNOSIS — R0609 Other forms of dyspnea: Secondary | ICD-10-CM

## 2022-07-05 MED ORDER — METOPROLOL TARTRATE 100 MG PO TABS: 100.0000 mg | ORAL_TABLET | Freq: Once | ORAL | 0 refills | Status: DC

## 2022-07-05 NOTE — Telephone Encounter (Signed)
-----   Message from Garwin Brothers, MD sent at 07/05/2022 12:14 PM EDT ----- I spoke to her and discussed the report with her at length.  She also has some dyspnea on exertion.  I would like to get a CT coronary angiography with FFR.  This will help especially if we need to make use of medication such as flecainide.  Copy primary care. Garwin Brothers, MD 07/05/2022 12:14 PM

## 2022-07-05 NOTE — Telephone Encounter (Signed)
Instructions reviewed and sent by MyChart for cardiac CT. Pt verbalized understanding and had no additional questions.

## 2022-07-10 ENCOUNTER — Telehealth (HOSPITAL_COMMUNITY): Payer: Self-pay | Admitting: Emergency Medicine

## 2022-07-10 NOTE — Telephone Encounter (Signed)
Reaching out to patient to offer assistance regarding upcoming cardiac imaging study; pt verbalizes understanding of appt date/time, parking situation and where to check in, pre-test NPO status and medications ordered, and verified current allergies; name and call back number provided for further questions should they arise Anthony Roland RN Navigator Cardiac Imaging [facility] [phone] office [phone] cell 

## 2022-07-12 ENCOUNTER — Ambulatory Visit (HOSPITAL_COMMUNITY)
Admission: RE | Admit: 2022-07-12 | Discharge: 2022-07-12 | Disposition: A | Discharge status: Home / Self Care | Payer: No Typology Code available for payment source | Source: Ambulatory Visit | Attending: Cardiology | Admitting: Cardiology

## 2022-07-12 DIAGNOSIS — R0609 Other forms of dyspnea: Secondary | ICD-10-CM

## 2022-07-12 DIAGNOSIS — R9430 Abnormal result of cardiovascular function study, unspecified: Secondary | ICD-10-CM | POA: Diagnosis not present

## 2022-07-12 MED ORDER — NITROGLYCERIN 0.4 MG SL SUBL
0.8000 mg | SUBLINGUAL_TABLET | Freq: Once | SUBLINGUAL | Status: AC
  Administered 2022-07-12: 0.8 mg via SUBLINGUAL

## 2022-07-12 MED ORDER — NITROGLYCERIN 0.4 MG SL SUBL
SUBLINGUAL_TABLET | SUBLINGUAL | Status: AC
  Filled 2022-07-12: qty 2

## 2022-07-12 MED ORDER — IOHEXOL 350 MG/ML SOLN
95.0000 mL | Freq: Once | INTRAVENOUS | Status: AC | PRN
  Administered 2022-07-12: 95 mL via INTRAVENOUS

## 2022-07-19 ENCOUNTER — Telehealth: Payer: Self-pay | Admitting: Cardiology

## 2022-07-19 ENCOUNTER — Ambulatory Visit: Payer: No Typology Code available for payment source | Attending: Cardiology

## 2022-07-19 VITALS — BP 110/72 | HR 59

## 2022-07-19 DIAGNOSIS — R002 Palpitations: Secondary | ICD-10-CM

## 2022-07-19 DIAGNOSIS — R0609 Other forms of dyspnea: Secondary | ICD-10-CM

## 2022-07-19 NOTE — Telephone Encounter (Signed)
Pt c/o medication issue:  1. Name of Medication:   Flecainide  2. How are you currently taking this medication (dosage and times per day)?   Not started yet  3. Are you having a reaction (difficulty breathing--STAT)?   N/A  4. What is your medication issue?   Patient stated she was prescribed this medication but the prescription is not yet at her pharmacy, [facility] - [place], [place] - [address].

## 2022-07-22 ENCOUNTER — Encounter: Payer: Self-pay | Admitting: Cardiology

## 2022-07-22 MED ORDER — FLECAINIDE ACETATE 50 MG PO TABS: 50.0000 mg | ORAL_TABLET | Freq: Two times a day (BID) | ORAL | 12 refills | Status: DC

## 2022-07-22 NOTE — Telephone Encounter (Signed)
Left vm for pt that the RX has been sent to the pharmacy.

## 2022-07-22 NOTE — Telephone Encounter (Signed)
Patient is calling to follow up on the medication. Patient stated that she still has not been able to get the medication. Patient is requesting a callback. Please advise.

## 2022-07-22 NOTE — Telephone Encounter (Signed)
Error

## 2022-07-23 ENCOUNTER — Ambulatory Visit: Payer: No Typology Code available for payment source

## 2022-07-24 NOTE — Progress Notes (Signed)
   Nurse Visit   Date of Encounter: 07/24/2022 ID: Marciana Uplinger, DOB 05/27/1960, MRN [id number]  PCP:  Paulina Fusi, MD   [facility] Providers Cardiologist:  None      Visit Details   VS:  BP 110/72 (BP Location: Right Arm, Patient Position: Sitting, Cuff Size: Normal)   Pulse (!) 59  , BMI There is no height or weight on file to calculate BMI.  Wt Readings from Last 3 Encounters:  06/03/22 217 lb 9.6 oz (98.7 kg)  05/02/22 213 lb (96.6 kg)  04/27/22 211 lb (95.7 kg)     Reason for visit: Perform EKG Performed today: EKG, Vitals, Provider consulted and Education Changes (medications, testing, etc.) : Start Flecanide 50 mg twice daily and set-up for EKG on Tuesday Length of Visit: 25 minutes    Medications Adjustments/Labs and Tests Ordered: Orders Placed This Encounter  Procedures   EKG 12-Lead   No orders of the defined types were placed in this encounter.    Signed, Samson Frederic, RN  07/24/2022 8:11 AM

## 2022-08-17 ENCOUNTER — Other Ambulatory Visit: Payer: Self-pay | Admitting: Cardiology

## 2022-08-22 ENCOUNTER — Other Ambulatory Visit: Payer: Self-pay

## 2022-08-22 ENCOUNTER — Telehealth: Payer: Self-pay | Admitting: Cardiology

## 2022-08-22 MED ORDER — FLECAINIDE ACETATE 50 MG PO TABS: 50.0000 mg | ORAL_TABLET | Freq: Two times a day (BID) | ORAL | 3 refills | Status: AC

## 2022-08-22 NOTE — Telephone Encounter (Signed)
*  STAT* If patient is at the pharmacy, call can be transferred to refill team.   1. Which medications need to be refilled? (please list name of each medication and dose if known) flecainide (TAMBOCOR) 50 MG tablet   2. Which pharmacy/location (including street and city if local pharmacy) is medication to be sent to? [facility] - [place], [place] - [facility] AT Portal to Registered [facility] Sites   3. Do they need a 30 day or 90 day supply? 90 day supply

## 2022-08-22 NOTE — Telephone Encounter (Signed)
Ref Flecainide 50mg  BID [facility] to [facility]

## 2022-08-30 ENCOUNTER — Ambulatory Visit (INDEPENDENT_AMBULATORY_CARE_PROVIDER_SITE_OTHER): Payer: No Typology Code available for payment source

## 2022-08-30 ENCOUNTER — Ambulatory Visit: Payer: No Typology Code available for payment source | Admitting: Podiatry

## 2022-08-30 ENCOUNTER — Other Ambulatory Visit: Payer: Self-pay | Admitting: Podiatry

## 2022-08-30 DIAGNOSIS — S92301A Fracture of unspecified metatarsal bone(s), right foot, initial encounter for closed fracture: Secondary | ICD-10-CM

## 2022-08-30 DIAGNOSIS — M79671 Pain in right foot: Secondary | ICD-10-CM

## 2022-08-30 NOTE — Progress Notes (Unsigned)
Subjective:   Patient ID: Brenda Allen, female   DOB: 62 y.o.   MRN: [id number]   HPI Chief Complaint  Patient presents with   Foot Pain    Rm 11  Right foot pain located at the lateral side of her with a nodule x 1 months. Aching soreness no edema, tingling or numbness. Hurts worse when walking.     It just started after stretching and   5th met baase area   ROS      Objective:  Physical Exam  ***     Assessment:  ***     Plan:  ***    -Has dicol

## 2022-09-02 ENCOUNTER — Other Ambulatory Visit: Payer: Self-pay

## 2022-09-11 ENCOUNTER — Encounter: Payer: Self-pay | Admitting: Cardiology

## 2022-09-11 ENCOUNTER — Ambulatory Visit: Payer: No Typology Code available for payment source | Attending: Cardiology | Admitting: Cardiology

## 2022-09-11 VITALS — BP 122/80 | HR 64 | Ht 64.0 in | Wt 210.2 lb

## 2022-09-11 DIAGNOSIS — I471 Supraventricular tachycardia, unspecified: Secondary | ICD-10-CM | POA: Diagnosis not present

## 2022-09-11 DIAGNOSIS — Z5181 Encounter for therapeutic drug level monitoring: Secondary | ICD-10-CM

## 2022-09-11 DIAGNOSIS — Z79899 Other long term (current) drug therapy: Secondary | ICD-10-CM | POA: Diagnosis not present

## 2022-09-11 DIAGNOSIS — E669 Obesity, unspecified: Secondary | ICD-10-CM

## 2022-09-11 DIAGNOSIS — R002 Palpitations: Secondary | ICD-10-CM | POA: Diagnosis not present

## 2022-09-11 NOTE — Patient Instructions (Addendum)
FDA-cleared personal EKG: The world's most clinically validated personal EKG, FDA-cleared to detect Atrial Fibrillation, Bradycardia, and Tachycardia. KardiaMobile is the most reliable way to check in on your heart from home. Take your EKG from anywhere: Capture a medical-grade EKG in 30 seconds and get an instant analysis right on your smartphone. KardiaMobile is small enough to fit in your pocket, so you can take it with you anywhere. Easy to use: Simply place your fingers on the sensors--no wires, patches, or gels. Recommended by doctors: A trusted resource, KardiaMobile is the #1 doctor-recommended personal EKG with more than 100 million EKGs recorded. Save or share your EKGs: With the press of a button, email your EKGs to your doctor or save them on your phone. Works with smartphones: Compatible with most Android and iOS smartphones and tablets. Check our compatibility chart. FSA/HSA eligible: Purchase using an FSA or HSA account (please confirm coverage with your insurance provider). Phone clip included with purchase, a $15 value. Conveniently take your device with you wherever you go.  https://store.kardia.com/products/kardiamobile   Step One- Record your EKG strip on Kardia Mobile app.   Step two- On Kardia EKG click "Download"   Step three- It will prompt you to make a password for this EKG. Please make the password "Revankar" so that we can view it.   Step four- Click on the little "upload" button (small box with an arrow in the middle) in the bottom left-hand corner of the screen.   Step five- Click "Save to Files"  Step six- Click on "On my iphone" and then "Pages" then press save in the top right-hand corner.   NOW GO TO MYCHART   Once on MyChart click "Messages"  Step one- Click "Send a message"  Step two- Click "Ask a medical question"   Step three- Click "Non urgent medical question"   Step four- Click on Rajan Revankar's name.  Step five- Click on the small  paperclip at the bottom of the screen  Step six- Click "Choose file"  Step seven- Pick the most recent EKG strip listed.   Once uploaded send the message!  Medication Instructions:  Your physician recommends that you continue on your current medications as directed. Please refer to the Current Medication list given to you today.  *If you need a refill on your cardiac medications before your next appointment, please call your pharmacy*   Lab Work: None ordered If you have labs (blood work) drawn today and your tests are completely normal, you will receive your results only by: MyChart Message (if you have MyChart) OR A paper copy in the mail If you have any lab test that is abnormal or we need to change your treatment, we will call you to review the results.   Testing/Procedures: None ordered   Follow-Up: At [facility], you and your health needs are our priority.  As part of our continuing mission to provide you with exceptional heart care, we have created designated Provider Care Teams.  These Care Teams include your primary Cardiologist (physician) and Advanced Practice Providers (APPs -  Physician Assistants and Nurse Practitioners) who all work together to provide you with the care you need, when you need it.  We recommend signing up for the patient portal called "MyChart".  Sign up information is provided on this After Visit Summary.  MyChart is used to connect with patients for Virtual Visits (Telemedicine).  Patients are able to view lab/test results, encounter notes, upcoming appointments, etc.  Non-urgent messages can   be sent to your provider as well.   To learn more about what you can do with MyChart, go to [email or web address].    Your next appointment:   4 month(s)  The format for your next appointment:   In Person  Provider:   Belva Crome, MD    Other Instructions none  Important Information About Sugar
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
## 2022-11-15 ENCOUNTER — Encounter: Payer: Self-pay | Admitting: Gastroenterology

## 2022-11-15 ENCOUNTER — Ambulatory Visit: Payer: 59 | Admitting: Gastroenterology

## 2022-11-15 VITALS — BP 110/70 | HR 93 | Ht 64.0 in | Wt 202.0 lb

## 2022-11-15 DIAGNOSIS — Z86010 Personal history of colonic polyps: Secondary | ICD-10-CM

## 2022-11-15 DIAGNOSIS — Z8719 Personal history of other diseases of the digestive system: Secondary | ICD-10-CM

## 2022-11-15 DIAGNOSIS — K580 Irritable bowel syndrome with diarrhea: Secondary | ICD-10-CM | POA: Diagnosis not present

## 2022-11-15 DIAGNOSIS — K219 Gastro-esophageal reflux disease without esophagitis: Secondary | ICD-10-CM

## 2022-11-15 MED ORDER — DICYCLOMINE HCL 10 MG PO CAPS: 10.0000 mg | ORAL_CAPSULE | Freq: Four times a day (QID) | ORAL | 4 refills | Status: AC | PRN

## 2022-11-15 MED ORDER — DEXLANSOPRAZOLE 60 MG PO CPDR: 1.0000 | DELAYED_RELEASE_CAPSULE | Freq: Every day | ORAL | 4 refills | Status: DC

## 2022-11-15 NOTE — Patient Instructions (Addendum)
_______________________________________________________  If your blood pressure at your visit was 140/90 or greater, please contact your primary care physician to follow up on this.  _______________________________________________________  If you are age 63 or older, your body mass index should be between 23-30. Your Body mass index is 34.67 kg/m. If this is out of the aforementioned range listed, please consider follow up with your Primary Care Provider.  If you are age 57 or younger, your body mass index should be between 19-25. Your Body mass index is 34.67 kg/m. If this is out of the aformentioned range listed, please consider follow up with your Primary Care Provider.   ________________________________________________________  The  GI providers would like to encourage you to use [facility] to communicate with providers for non-urgent requests or questions.  Due to long hold times on the telephone, sending your provider a message by [facility] may be a faster and more efficient way to get a response.  Please allow 48 business hours for a response.  Please remember that this is for non-urgent requests.  _______________________________________________________  We have sent the following medications to your pharmacy for you to pick up at your convenience: Bentyl Dexilant  Follow up as needed.  Thank you,  Dr. Lynann Bologna
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
## 2022-12-09 ENCOUNTER — Other Ambulatory Visit: Payer: Self-pay | Admitting: Gastroenterology

## 2022-12-11 ENCOUNTER — Other Ambulatory Visit (HOSPITAL_COMMUNITY): Payer: Self-pay

## 2022-12-12 ENCOUNTER — Other Ambulatory Visit (HOSPITAL_COMMUNITY): Payer: Self-pay

## 2022-12-12 ENCOUNTER — Telehealth: Payer: Self-pay | Admitting: Pharmacy Technician

## 2022-12-12 NOTE — Telephone Encounter (Signed)
PA has been submitted, and telephone encounter has been created. 

## 2022-12-12 NOTE — Telephone Encounter (Signed)
Pharmacy Patient Advocate Encounter   Received notification from CoverMyMeds that prior authorization for DEXLANSOPRAZOLE 60MG  is required/requested.   Insurance verification completed.   The patient is insured through [facility] .   Per test claim: PA required; PA submitted to [facility] via CoverMyMeds Key/confirmation #/EOC [id number] Status is pending

## 2022-12-17 ENCOUNTER — Other Ambulatory Visit (HOSPITAL_COMMUNITY): Payer: Self-pay

## 2022-12-17 NOTE — Telephone Encounter (Signed)
Pharmacy Patient Advocate Encounter  Received notification from [facility] that Prior Authorization for DEXLANSOPRAZOLE 60MG  has been APPROVED from 9.12.24 to 9.12.25   PA #/Case ID/Reference #: [id number]

## 2022-12-20 ENCOUNTER — Telehealth: Payer: Self-pay | Admitting: Gastroenterology

## 2022-12-20 MED ORDER — DEXLANSOPRAZOLE 60 MG PO CPDR: 1.0000 | DELAYED_RELEASE_CAPSULE | Freq: Every day | ORAL | 6 refills | Status: DC

## 2022-12-20 NOTE — Telephone Encounter (Signed)
Medication sent and patient made aware [facility]

## 2022-12-20 NOTE — Telephone Encounter (Signed)
Inbound call from patient, states she has been unable to get the dexlansoprazole, pharmacy is stating order needs to be signed. Please advise.

## 2023-01-01 MED ORDER — DEXLANSOPRAZOLE 60 MG PO CPDR: 1.0000 | DELAYED_RELEASE_CAPSULE | Freq: Every day | ORAL | 6 refills | Status: DC

## 2023-01-01 NOTE — Telephone Encounter (Signed)
Brenda Allen with [facility] said that she changed the address to the [facility] one and I sent in a new script that has the [place] address and will call tomorrow to make sure  Number [phone]    main line [facility] [phone]

## 2023-01-01 NOTE — Telephone Encounter (Signed)
Called patient and transferred her over to the company to switch the address

## 2023-01-01 NOTE — Addendum Note (Signed)
Addended by: Alberteen Sam E on: 01/01/2023 03:26 PM   Modules accepted: Orders

## 2023-01-01 NOTE — Telephone Encounter (Signed)
Called [facility] and they said that her address on file wasn't right so gave the address we had and the telephone number and she said it should be shipped out in the next 2 days hopefully. Address on file had [address] I was told  LVM for patient regarding this and mychart message

## 2023-01-01 NOTE — Telephone Encounter (Signed)
Inbound call from patient, stated address on file was not correct, new address updated for medication.

## 2023-01-01 NOTE — Telephone Encounter (Signed)
Inbound call from patient, states she still has not been able to get the medication below. She states they state that pharmacy says that the doctor has not signed the order yet.

## 2023-01-02 ENCOUNTER — Ambulatory Visit: Payer: No Typology Code available for payment source | Admitting: Gastroenterology

## 2023-01-02 NOTE — Telephone Encounter (Signed)
[facility]  and they said they will be sending it to [facility] and called patient and let her know about his. [facility] said [facility] was defaulting back to [place] but I told the patient moved to [facility]
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
## 2023-01-07 MED ORDER — DEXLANSOPRAZOLE 60 MG PO CPDR: 1.0000 | DELAYED_RELEASE_CAPSULE | Freq: Every day | ORAL | 3 refills | Status: AC

## 2023-01-07 NOTE — Addendum Note (Signed)
Addended by: Alberteen Sam E on: 01/07/2023 03:41 PM   Modules accepted: Orders

## 2023-01-07 NOTE — Telephone Encounter (Signed)
Sent it there for her and she will call with any concerns

## 2023-01-07 NOTE — Telephone Encounter (Signed)
[facility] should arrive today. Per patient hr neighbor got her package and she already gone. She will look and see how expensive the medication is before we send a script because I told her insurance may or may not pay for it and she will call back
